# Patient Record
Sex: Female | Born: 1960 | Race: Black or African American | Hispanic: No | Marital: Single | Smoking: Never smoker
Health system: Southern US, Community
[De-identification: ages and names within clinical notes are randomized; demographics above are authoritative.]

## PROBLEM LIST (undated history)

## (undated) DIAGNOSIS — E119 Type 2 diabetes mellitus without complications: Secondary | ICD-10-CM

## (undated) DIAGNOSIS — E669 Obesity, unspecified: Secondary | ICD-10-CM

## (undated) HISTORY — DX: Type 2 diabetes mellitus without complications: E11.9

## (undated) HISTORY — PX: ABDOMINAL HYSTERECTOMY: SHX81

## (undated) HISTORY — DX: Obesity, unspecified: E66.9

---

## 2007-08-11 ENCOUNTER — Emergency Department (HOSPITAL_BASED_OUTPATIENT_CLINIC_OR_DEPARTMENT_OTHER): Admission: EM | Admit: 2007-08-11 | Discharge: 2007-08-11 | Payer: Self-pay | Admitting: Emergency Medicine

## 2009-02-20 ENCOUNTER — Emergency Department (HOSPITAL_COMMUNITY): Admission: EM | Admit: 2009-02-20 | Discharge: 2009-02-20 | Payer: Self-pay | Admitting: Family Medicine

## 2012-01-21 ENCOUNTER — Encounter: Payer: 59 | Attending: Family Medicine | Admitting: *Deleted

## 2012-01-28 ENCOUNTER — Encounter: Payer: Self-pay | Admitting: *Deleted

## 2012-01-28 NOTE — Progress Notes (Signed)
  Patient was seen on 01/21/2012 for the first of a series of three diabetes self-management courses at the [facility]. Current A1c on 08/15/11 was 7.4% The following learning objectives were met by the patient during this course:   Defines the role of glucose and insulin  Identifies type of diabetes and pathophysiology  Defines the diagnostic criteria for diabetes and prediabetes  States the risk factors for Type 2 Diabetes  States the symptoms of Type 2 Diabetes  Defines Type 2 Diabetes treatment goals  Defines Type 2 Diabetes treatment options  States the rationale for glucose monitoring  Identifies A1C, glucose targets, and testing times  Identifies proper sharps disposal  Defines the purpose of a diabetes food plan  Identifies carbohydrate food groups  Defines effects of carbohydrate foods on glucose levels  Identifies carbohydrate choices/grams/food labels  States benefits of physical activity and effect on glucose  Review of suggested activity guidelines  Handouts given during class include:  Type 2 Diabetes: Basics Book  My Food Plan Book  Food and Activity Log  Follow-Up Plan: Core Class 2

## 2012-09-08 ENCOUNTER — Encounter: Payer: 59 | Attending: Family Medicine | Admitting: *Deleted

## 2012-09-08 DIAGNOSIS — E119 Type 2 diabetes mellitus without complications: Secondary | ICD-10-CM | POA: Insufficient documentation

## 2012-09-08 DIAGNOSIS — Z713 Dietary counseling and surveillance: Secondary | ICD-10-CM | POA: Insufficient documentation

## 2012-09-09 NOTE — Progress Notes (Signed)
  Patient was seen on 09/08/12 for the second of a series of three diabetes self-management courses at the [facility]. The following learning objectives were met by the patient during this course:   Explain basic nutrition maintenance and quality assurance  Describe causes, symptoms and treatment of hypoglycemia and hyperglycemia  Explain how to manage diabetes during illness  Describe the importance of good nutrition for health and healthy eating strategies  List strategies to follow meal plan when dining out  Describe the effects of alcohol on glucose and how to use it safely  Describe problem solving skills for day-to-day glucose challenges  Describe strategies to use when treatment plan needs to change  Identify important factors involved in successful weight loss  Describe ways to remain physically active  Describe the impact of regular activity on insulin resistance   Handouts given in class:  Refrigerator magnet for Sick Day Guidelines  [facility] Oral medication/insulin handout  Follow-Up Plan: Patient will attend the final class of the ADA Diabetes Self-Care Education.    

## 2012-09-22 DIAGNOSIS — E119 Type 2 diabetes mellitus without complications: Secondary | ICD-10-CM

## 2012-09-23 NOTE — Progress Notes (Signed)
Patient was seen on 09/22/12 for the third of a series of three diabetes self-management courses at the [facility]. The following learning objectives were met by the patient during this course:    Describe how diabetes changes over time   Identify diabetes complications and ways to prevent them   Describe strategies that can promote heart health including lowering blood pressure and cholesterol   Describe strategies to lower dietary fat and sodium in the diet   Identify physical activities that benefit cardiovascular health   Describe role of stress on blood glucose and develop strategies to address psychosocial issues   Evaluate success in meeting personal goal   Describe the belief that they can live successfully with diabetes day to day   Establish 2-3 goals that they will plan to diligently work on until they return for the free 58-month follow-up visit  The following handouts were given in class:  Goal setting handout  Class evaluation form  Low-sodium seasoning tips  Stress management handout  Your patient has established the following 4 month goals for diabetes self-care:  Count carbohydrates at most meals and snacks  Reduce fat in my diet by eating less fried foods  Take diabetes medications as scheduled  Your patient has identified these potential barriers to change:  None indicated  Your patient has identified their diabetes self-care support plan as:  My sister   Follow-Up Plan: Patient was offered a 4 month follow-up visit for diabetes self-management education.

## 2013-01-26 ENCOUNTER — Ambulatory Visit: Payer: 59 | Admitting: *Deleted

## 2014-06-17 ENCOUNTER — Other Ambulatory Visit: Payer: Self-pay | Admitting: *Deleted

## 2014-06-19 NOTE — Patient Outreach (Addendum)
[facility]) Care Management   06/19/2014  Kimberly Wilkins 01/07/1961 [id number]  Kimberly AmorWillie D Creelman is an 54 y.o. female who presents for routine Link To Wellness follow up.  Subjective:  She states she continues to consistently follow a CHO controlled meal plan and is pleased with her ongoing weight loss and blood sugar readings. She says she feels much better and her goal is to lose a total of 30 lbs by May 30th. She says she only eats at restaurants that offer Weight Watcher options like [employer]. States her blood sugar variance both fasting and nonfasting is 74-153 for the last month. She is anxious to have her A1C checked when she sees Dr. Allena KatzPatel on 07/21/14 as she thinks it will have significantly improved.  Objective:   Review of Systems  Constitutional: Negative.     Physical Exam  Constitutional: She is oriented to person, place, and time. She appears well-developed and well-nourished.  Neurological: She is alert and oriented to person, place, and time.  Psychiatric: She has a normal mood and affect. Her behavior is normal. Judgment and thought content normal.   Filed Weights   06/17/14 1606  Weight: 225 lb (102.059 kg)   Filed Vitals:   06/17/14 1606  BP: 125/88    Current Medications:   Current Outpatient Prescriptions  Medication Sig Dispense Refill  . aspirin 81 MG tablet Take 81 mg by mouth daily.    Marland Kitchen. lisinopril (PRINIVIL,ZESTRIL) 2.5 MG tablet Take 2.5 mg by mouth daily.    . metFORMIN (GLUMETZA) 500 MG (MOD) 24 hr tablet Take 500 mg by mouth daily with breakfast.    . pravastatin (PRAVACHOL) 40 MG tablet Take 40 mg by mouth daily.     No current facility-administered medications for this visit.    Functional Status:   In your present state of health, do you have any difficulty performing the following activities: 06/17/2014  Hearing? N  Vision? N  Difficulty concentrating or making decisions? N  Walking or climbing stairs? N  Dressing or  bathing? N  Doing errands, shopping? N    Fall/Depression Screening:    PHQ 2/9 Scores 06/17/2014  PHQ - 2 Score 0   [facility] CM Care Plan        Patient Outreach from 06/17/2014 in [facility] Link To Wellness   Care Plan Problem One  Type II DM currently meeting CBG pre and post meal targets but most recent A1C not meeting target as evidenced by 8.2% on 05/13/14   Care Plan for Problem One  Active   Interventions for Problem One Long Term Goal  reviewed basic core metabolic defects associated with Type II DM, reviewed role of obesity on insulin resistance, congratulated Kimberly Wilkins on her ongoing weight loss, reviewed patient medications and reviewed mechanism of action of Glumetza and Januvia,  reviewed effect of exercise on inuslin sensitivity and congratulated her on her consistent exercise program, reviewed meal plan and strategies to use when eating out to avoid CHO overloading, reviewed patient's glucometer history , pre and post meal targets and congratulated Kimberly Wilkins on her excellent readings for the past month, reviewed upcoming appointment with Dr. Allena KatzPatel on 07/21/14 and reinforced the importance of keeping the appointment   [facility]31-[facility])  Improved glycemic control as evidenced by A1C <7.5% at next check and CBGs meeting target 75% of the time   [facility] Start Date  06/17/14     Assessment:   Link  To Wellness patient demonstrating significant behavior changes resulting in significantly improved pre and post meal blood sugars.  Plan:  RNCM to fax today's office visit note to Dr. Allena Katz. RNCM will meet monthly and as needed with patient per Link To Wellness program guidelines to assist with Type II DM self-management and assess patient's progress toward mutually set goals.   Bary Richard RN,CCM,CDE [facility] Care Management Coordinator Office Phone [phone] Office Fax [phone]

## 2014-09-21 ENCOUNTER — Other Ambulatory Visit: Payer: Self-pay | Admitting: *Deleted

## 2014-09-21 NOTE — Patient Outreach (Addendum)
Kimberly Wilkins was instructed to follow up with this RNCM after she saw Dr. Allena KatzPatel in mid May 2016 to arrange for Link To Wellness follow up but no call has been received. Message left on Kimberly Wilkins's cell phone number to contact this RNCM as soon as possible to arrange follow up.  Kimberly RichardJanet S. Virgie Chery RN,CCM,CDE [facility] Care Management Coordinator Link To Wellness Office Phone [phone] Office Fax [phone]

## 2014-09-22 ENCOUNTER — Other Ambulatory Visit: Payer: Self-pay | Admitting: *Deleted

## 2014-09-22 NOTE — Patient Outreach (Signed)
Spoke with Kimberly Wilkins via phone after she left message this morning returning this RNCM's call from yesterday. She says her A1C was 7.8% or 7.9% when Dr. Allena Katz checked it in mid May. She says she forgot she was to call and arrange Link To Wellness follow up. She says the only change Dr. Allena Katz made to her treatment plan was to increase her Lisinopril but she is not sure of the dosage. He told her he was pleased with her lifestyle changes of consistent exercise and consistent adherence to a CHO controlled meal plan. Link To Wellness follow up arranged for August 18th at 11:00am. Bary Richard RN,CCM,CDE [facility] Care Management Coordinator Link To Wellness Office Phone [phone] Office Fax [phone]

## 2014-10-17 ENCOUNTER — Other Ambulatory Visit: Payer: Self-pay | Admitting: *Deleted

## 2014-10-17 ENCOUNTER — Encounter: Payer: Self-pay | Admitting: *Deleted

## 2014-10-17 NOTE — Patient Outreach (Signed)
[facility]) Care Management   10/17/2014  Kimberly Wilkins 1960-11-14 [id number]  Kimberly Wilkins is an 54 y.o. female who presents to the [facility] CM office for routine Link To Wellness follow up for Type II DM and hyperlipidemia self management assistance.  Subjective:  Kimberly Wilkins is frustrated that she cannot lose weight faster, her short term goal is to get under 200 lbs and eventually weight 165 lbs. She continues to check her CBG 1-2 and sometimes 3x daily and she is pleased with the readings as they consistently meet targets. She says she will see Dr. Allena Katz on 11/13/14 and he will check her A1C.  Objective:   Review of Systems  Constitutional: Negative.     Physical Exam  Constitutional: She is oriented to person, place, and time. She appears well-developed and well-nourished.  Neurological: She is alert and oriented to person, place, and time.  Skin: Skin is warm and dry.  Psychiatric: She has a normal mood and affect. Her behavior is normal. Judgment and thought content normal.   Filed Weights   10/17/14 1119  Weight: 220 lb 12.8 oz (100.154 kg)   Filed Vitals:   10/17/14 1119  BP: 120/65   Current Medications:   Current Outpatient Prescriptions  Medication Sig Dispense Refill  . lisinopril (PRINIVIL,ZESTRIL) 2.5 MG tablet Take 2.5 mg by mouth daily.    . metFORMIN (GLUMETZA) 500 MG (MOD) 24 hr tablet Take 500 mg by mouth daily with breakfast.    . pravastatin (PRAVACHOL) 40 MG tablet Take 40 mg by mouth daily.    . sitaGLIPtin (JANUVIA) 100 MG tablet Take 100 mg by mouth daily.    Marland Kitchen aspirin 81 MG tablet Take 81 mg by mouth daily.     No current facility-administered medications for this visit.    Functional Status:   In your present state of health, do you have any difficulty performing the following activities: 10/17/2014 06/17/2014  Hearing? N N  Vision? N N  Difficulty concentrating or making decisions? N N  Walking or climbing stairs? N N  Dressing  or bathing? N N  Doing errands, shopping? N N    Fall/Depression Screening:    PHQ 2/9 Scores 06/17/2014  PHQ - 2 Score 0   [facility] CM Care Plan Problem One        Patient Outreach from 10/17/2014 in [employer]   Care Plan Problem One  Type II DM currently meeting CBG pre and post meal targets but most recent A1C not meeting target as evidenced by 8.2% on 05/13/14   Care Plan for Problem One  Active   [facility] Long Term Goal (31-90 days)  Improved glycemic control as evidenced by A1C <7.5% at next check and CBGs meeting target 75% of the time   [facility] Long Term Goal Start Date  10/17/14   Interventions for Problem One Long Term Goal  reviewed basic core metabolic defects associated with Type II DM, reviewed role of obesity on insulin resistance, congratulated Kimberly Wilkins on her ongoing weight loss, reviewed patient medications and reviewed mechanism of action of Glumetza and Januvia, reviewed effect of exercise on insulin sensitivity and congratulated her on her consistent exercise program, reviewed meal plan, reviewed patient's glucometer history , pre and post meal targets and congratulated Kimberly Wilkins on her excellent readings for the past month, reviewed upcoming appointment with Dr. Allena Katz on 11/13/14 and reinforced the importance of keeping the appointment, timing of Link To Wellness follow up will be based on  A1C results when she sees Dr. Allena Katz in September     Assessment:   [employer] employee and Link To Wellness member with Type II DM, all glucometer readings meeting blood sugar targets, BP meeting target, will see her primary care provider on 11/13/14. Obese but continues to lose weight with CHO controlled diet and consistent exercise regimen.  Plan:  RNCM to fax today's office visit note to Dr. Ivory Broad and Dr. Allena Katz. RNCM will meet quarterly and as needed with patient per Link To Wellness program guidelines to assist with Type II DM and weight loss self-management and assess patient's progress  toward mutually set goals.  Bary Richard RN,CCM,CDE [facility] Care Management Coordinator Link To Wellness Office Phone [phone] Office Fax [phone]

## 2015-02-24 ENCOUNTER — Other Ambulatory Visit: Payer: Self-pay | Admitting: *Deleted

## 2015-02-24 NOTE — Patient Outreach (Signed)
Returned call to [name] per her request to arrange Link To Wellness follow up appointment. Left message on her mobile number asking her to return call and leave dates and times she can meet in the next month. Bary RichardJanet S. Kharisma Glasner RN,CCM,CDE [facility] Care Management Coordinator Link To Wellness Office Phone [phone] Office Fax [phone]

## 2015-03-17 MED FILL — TRUEtest TEST STRP: 33 days supply | Qty: 200 | Fill #1

## 2015-03-27 DIAGNOSIS — I10 Essential (primary) hypertension: Secondary | ICD-10-CM | POA: Diagnosis not present

## 2015-03-27 DIAGNOSIS — E785 Hyperlipidemia, unspecified: Secondary | ICD-10-CM | POA: Diagnosis not present

## 2015-03-27 DIAGNOSIS — E1165 Type 2 diabetes mellitus with hyperglycemia: Secondary | ICD-10-CM | POA: Diagnosis not present

## 2015-03-27 MED FILL — JANUVIA 100 MG TABLET: 100 | 30 days supply | Qty: 30 | Fill #0

## 2015-03-30 ENCOUNTER — Ambulatory Visit: Payer: Self-pay | Admitting: *Deleted

## 2015-04-10 ENCOUNTER — Ambulatory Visit: Payer: Self-pay | Admitting: *Deleted

## 2015-05-01 MED FILL — PRAVASTATIN NA 40 MG TAB: 40 | 90 days supply | Qty: 90 | Fill #3

## 2015-05-01 MED FILL — metFORMIN HCL 1000 MG TABS: 1000 | 90 days supply | Qty: 180 | Fill #3

## 2015-05-01 MED FILL — JANUVIA 100 MG TABLET: 100 | 30 days supply | Qty: 30 | Fill #1

## 2015-05-23 ENCOUNTER — Other Ambulatory Visit: Payer: Self-pay | Admitting: *Deleted

## 2015-05-23 NOTE — Patient Outreach (Signed)
[facility]) Care Management   05/23/2015  Kimberly Wilkins 31-Mar-1960 [id number]  Kimberly Wilkins is an 55 y.o. female who presents to the [facility] Care Management office for routine Link To Wellness follow up for self management assistance with Type II DM.  Subjective:  [name] says she saw Dr. Allena Katz in February and she had gained 11 lbs since her last visit with him and she attributed the weight gain to holiday eating. She says her metformin dose was increased to achieve fasting blood sugar target of 80-100. She says her goal is to weight <218  lbs when she sess Dr. Allena Katz in May. She states her long term weight loss goals if <200 lbs and eventually 175 lbs so that she has a normal BMI. She says she is following a 1200 calorie diet and uses a teflon pan for frying so she doesn't have to use any oil or butter.  She says she is checking her blood sugar at least twice daily and sometimes more often to help her identify foods that elevate her blood sugar above target. She reports fasting blood sugar this morning as 124 and she says any time she eats fruit late in the evening her blood sugar is always higher than target the next morning. She says she has been using nuts as a snack instead of fruit to lower her blood sugars. 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  Objective:   Review of Systems  Constitutional: Negative.     Physical Exam  Constitutional: She is oriented to person, place, and time. She appears well-developed and well-nourished.  Respiratory: Effort normal.  Neurological: She is alert and oriented to person, place, and time.  Skin: Skin is warm and dry.  Psychiatric: She has a normal mood and affect. Her behavior is normal. Judgment and thought content normal.   Filed Vitals:   05/23/15 1212  BP: 112/75   Filed Weights   05/23/15 1212  Weight: 226 lb (102.513  kg)   Current Medications:   Current Outpatient Prescriptions  Medication Sig Dispense Refill  . lisinopril (PRINIVIL,ZESTRIL) 2.5 MG tablet Take 2.5 mg by mouth daily.    . metFORMIN (GLUMETZA) 500 MG (MOD) 24 hr tablet Take 1,000 mg by mouth 2 (two) times daily with a meal.     . pravastatin (PRAVACHOL) 40 MG tablet Take 40 mg by mouth daily.    . sitaGLIPtin (JANUVIA) 100 MG tablet Take 100 mg by mouth daily.    Marland Kitchen aspirin 81 MG tablet Take 81 mg by mouth daily. Reported on 05/23/2015. States she is not taking.      No current facility-administered medications for this visit.    Functional Status:   In your present state of health, do you have any difficulty performing the following activities: 05/23/2015 10/17/2014  Hearing? N N  Vision? N N  Difficulty concentrating or making decisions? N N  Walking or climbing stairs? N N  Dressing or bathing? N N  Doing errands, shopping? N N    Fall/Depression Screening:    PHQ 2/9 Scores 05/23/2015 06/17/2014  PHQ - 2 Score 0 0    Assessment:   [employer] employee and Link To Wellness member with Type II DM currently not meeting Hgb A1C target of <7.0% ( Hgb A1C= 7.8% in February 2017)  Plan:    [facility] CM Care Plan Problem One  Most Recent Value   Care Plan Problem One  Type II DM currently meeting most CBG pre and post meal targets but most recent Hgb A1C not meeting target as evidenced by 7.8%  in February 2017   Role Documenting the Problem One  Care Management Coordinator   Care Plan for Problem One  Active   [facility] Long Term Goal (31-90 days)  Improved glycemic control as evidenced by Hgb A1C <7.0% at next check, CBGs meeting target 75% of the time, and weight of 218 lbs or less at next Link To Wellness visit   [facility] Long Term Goal Start Date  05/23/15   Interventions for Problem One Long Term Goal  reviewed basic core metabolic defects associated with Type II DM, reviewed role of central obesity on insulin resistance, discussed  strategies for weight loss success, provided glycemic index food chart to post on her refrigerator to help her with food choices and glycemic control, reviewed patient medications and reviewed mechanism of action of Glumetza and Januvia, reviewed effect of exercise on insulin sensitivity and congratulated her on her consistent exercise program, reviewed meal plan, reviewed patient's glucometer history , reviewed pre and post meal targets, discussed [employer] app and encouraged her to enroll if she has the opportunity this fall to provided electronic device assistance with DM management, reviewed upcoming appointment with Dr. Allena KatzPatel in May and reinforced the importance of keeping the appointment, arranged for Link To Wellness follow up in June      RNCM to fax today's office visit note to Dr. Allena KatzPatel. RNCM will meet quarterly and as needed with patient per Link To Wellness program guidelines to assist with Type II DM self-management and assess patient's progress toward mutually set goals.  Bary RichardJanet S. Hauser RN,CCM,CDE [facility] Care Management Coordinator Link To Wellness Office Phone [phone] Office Fax [phone]

## 2015-05-25 ENCOUNTER — Ambulatory Visit: Payer: Self-pay | Admitting: *Deleted

## 2015-05-26 MED FILL — JANUVIA 100 MG TABLET: 100 | 30 days supply | Qty: 30 | Fill #2

## 2015-05-26 MED FILL — LISINOPRIL 5 MG TABLET: 5 | 90 days supply | Qty: 90 | Fill #3

## 2015-06-28 MED FILL — JANUVIA 100 MG TABLET: 100 | 30 days supply | Qty: 30 | Fill #3

## 2015-08-02 DIAGNOSIS — E782 Mixed hyperlipidemia: Secondary | ICD-10-CM | POA: Diagnosis not present

## 2015-08-02 DIAGNOSIS — I10 Essential (primary) hypertension: Secondary | ICD-10-CM | POA: Diagnosis not present

## 2015-08-02 DIAGNOSIS — E1165 Type 2 diabetes mellitus with hyperglycemia: Secondary | ICD-10-CM | POA: Diagnosis not present

## 2015-08-02 MED FILL — JANUVIA 100 MG TABLET: 100 | 90 days supply | Qty: 90 | Fill #0 | Status: TO

## 2015-08-02 MED FILL — metFORMIN HCL 1000 MG TABS: 1000 | 90 days supply | Qty: 180 | Fill #0 | Status: TO

## 2015-08-02 MED FILL — LISINOPRIL 5 MG TABLET: 5 | 90 days supply | Qty: 90 | Fill #0

## 2015-08-07 DIAGNOSIS — E1165 Type 2 diabetes mellitus with hyperglycemia: Secondary | ICD-10-CM | POA: Diagnosis not present

## 2015-08-07 DIAGNOSIS — I10 Essential (primary) hypertension: Secondary | ICD-10-CM | POA: Diagnosis not present

## 2015-08-07 DIAGNOSIS — E782 Mixed hyperlipidemia: Secondary | ICD-10-CM | POA: Diagnosis not present

## 2015-08-29 ENCOUNTER — Encounter: Payer: Self-pay | Admitting: *Deleted

## 2015-08-29 ENCOUNTER — Other Ambulatory Visit: Payer: Self-pay | Admitting: *Deleted

## 2015-08-29 VITALS — BP 138/88 | Ht 67.5 in | Wt 227.8 lb

## 2015-08-29 DIAGNOSIS — E119 Type 2 diabetes mellitus without complications: Secondary | ICD-10-CM

## 2015-08-29 DIAGNOSIS — E669 Obesity, unspecified: Secondary | ICD-10-CM

## 2015-08-29 NOTE — Patient Outreach (Signed)
[facility]N) Care Management   08/29/2015  Kimberly Wilkins 06/03/1960 [id number]  Kimberly AmorWillie D Wilkins is an 55 y.o. female who presents to the [address] [facility] Care Management office for routine Link To Wellness follow up for self management assistance with Type II DM and obesity.  Subjective: Kimberly Wilkins says the stress in her life just decreased dramatically as she graduated from [employer] in [place] with a master's degree in Electronic Data SystemsHealth Care Administration. She says she hopes to remain with [employer] in a new position that would support her educational background as she has 25 years of service with [employer]. She says she has been focusing on her exercise program and works out every day, either at the gym at [employer] or at [employer]. She continues to check her blood sugar 2-3 times daily and says she has not seen any numbers over 200 in the last few months. She brings her lab report of 6/5 and says her POC Hgb A1C was 7.3% so she says Dr. Allena KatzPatel was happy although he wants her Hgb A1C <6.5%. She is frustrated that she has not been able to lose any weight despite following a CHO controlled meal plan and exercising. She agrees to meet with a RD for weight management assistance.  Objective:   Review of Systems  Constitutional: Negative.     Physical Exam  Constitutional: She is oriented to person, place, and time. She appears well-developed and well-nourished.  Respiratory: Effort normal.  Neurological: She is alert and oriented to person, place, and time.  Skin: Skin is warm and dry.  Psychiatric: She has a normal mood and affect. Her behavior is normal. Judgment and thought content normal.   Filed Weights   08/29/15 1120  Weight: 227 lb 12.8 oz (103.329 kg)   Filed Vitals:   08/29/15 1120  BP: 138/88    Encounter Medications:   Outpatient Encounter Prescriptions as of 08/29/2015  Medication Sig Note  . lisinopril (PRINIVIL,ZESTRIL) 2.5 MG  tablet Take 2.5 mg by mouth daily. 10/17/2014: Taking 5 mg now 07/21/14  . metFORMIN (GLUMETZA) 500 MG (MOD) 24 hr tablet Take 1,000 mg by mouth 2 (two) times daily with a meal.    . pravastatin (PRAVACHOL) 40 MG tablet Take 40 mg by mouth daily.   . sitaGLIPtin (JANUVIA) 100 MG tablet Take 100 mg by mouth daily.   Marland Kitchen. aspirin 81 MG tablet Take 81 mg by mouth daily. Reported on 08/29/2015 08/29/2015: MD told her she doesn't have to take it until you are older   No facility-administered encounter medications on file as of 08/29/2015.    Functional Status:   In your present state of health, do you have any difficulty performing the following activities: 08/29/2015 05/23/2015  Hearing? N N  Vision? N N  Difficulty concentrating or making decisions? N N  Walking or climbing stairs? N N  Dressing or bathing? N N  Doing errands, shopping? N N    Fall/Depression Screening:    PHQ 2/9 Scores 05/23/2015 06/17/2014  PHQ - 2 Score 0 0    Assessment:    employee and Link To Wellness member with Type II DM and obesity with improved glycemic control as evidenced by Hgb A1C= 7.3% on 07/05/15, (previous Hgb A1C=7.8% in Feb 2017)  normal lipid panel on 08/07/15 and BP consistently meeting targets.  Plan:   [facility] Care [facility] Problem One        Most Recent Value  Care Plan Problem One  Type II DM and obesity (BMI= 35.13) currently meeting most CBG pre and post meal targets, improved glycemic control as evidenced by Hgb A1C= 7.3% on 07/05/15 previous Hgb A1C= 7.8%  in February 2017   Role Documenting the Problem One  Care Management Coordinator   Care Plan for Problem One  Active   THN Long Term Goal (31-90 days)  Improved glycemic control as evidenced by Hgb A1C <7.0% at next check, CBGs meeting target 75% of the time, and evidence of weight loss and/or no weight gain at next Link To Wellness visit   THN Long Term Goal Start Date  08/29/15   Interventions for Problem One Long Term Goal  Congratulated Kimberly Wilkins  on achieving her master's degree in Electronic Data SystemsHealth Care Administration, reviewed basic core metabolic defects associated with Type II DM, reviewed role of central obesity on insulin resistance, discussed strategies for weight loss success,  made referral to RD for assist with weight management, reviewed patient medications and reviewed mechanism of action of Glumetza and Januvia, reviewed effect of exercise on insulin sensitivity and congratulated her on her consistent exercise program, reviewed meal plan, reviewed patient's glucometer history , reviewed pre and post meal targets, instructed her on the use of the True Metrix glucometer and advised her to go to [facility] to pick up a True Metrix meter and ask the pharmacy staff to set the correct time and date, assisted Kimberly Wilkins with claiming her [facility] self knowledge badge and logging her activity on the [employer] Well website,reviewed upcoming appointment with Dr. Allena KatzPatel on Oct 2nd and reinforced the importance of keeping the appointment, arranged for Link To Wellness follow up on 10/19/15      RNCM to fax today's office visit note to Dr. Allena KatzPatel.  RNCM will meet quarterly and as needed with patient per Link To Wellness program guidelines to assist with Type II DM and obesity self-management and assess patient's progress toward mutually set goals.  Bary RichardJanet S. Kyelle Urbas RN,CCM,CDE [facility] Care Management Coordinator Link To Wellness Office Phone [phone] Office Fax [phone]

## 2015-08-30 MED FILL — PRAVASTATIN NA 40 MG TAB: 40 | 30 days supply | Qty: 30 | Fill #0

## 2015-08-30 MED FILL — TRUE METRIX GLUCOSE TEST ST: 33 days supply | Qty: 200 | Fill #0

## 2015-10-02 ENCOUNTER — Ambulatory Visit: Payer: 59 | Admitting: Skilled Nursing Facility1

## 2015-10-10 DIAGNOSIS — H35131 Retinopathy of prematurity, stage 2, right eye: Secondary | ICD-10-CM | POA: Diagnosis not present

## 2015-10-10 DIAGNOSIS — H2513 Age-related nuclear cataract, bilateral: Secondary | ICD-10-CM | POA: Diagnosis not present

## 2015-10-10 DIAGNOSIS — H524 Presbyopia: Secondary | ICD-10-CM | POA: Diagnosis not present

## 2015-10-10 DIAGNOSIS — E119 Type 2 diabetes mellitus without complications: Secondary | ICD-10-CM | POA: Diagnosis not present

## 2015-10-16 MED FILL — PRAVASTATIN NA 40 MG TAB: 40 | 30 days supply | Qty: 30 | Fill #1

## 2015-10-19 ENCOUNTER — Encounter: Payer: 59 | Attending: Family Medicine | Admitting: Dietician

## 2015-10-19 ENCOUNTER — Encounter: Payer: Self-pay | Admitting: Dietician

## 2015-10-19 ENCOUNTER — Other Ambulatory Visit: Payer: Self-pay | Admitting: *Deleted

## 2015-10-19 VITALS — BP 120/75 | Ht 67.5 in | Wt 230.8 lb

## 2015-10-19 DIAGNOSIS — E119 Type 2 diabetes mellitus without complications: Secondary | ICD-10-CM

## 2015-10-19 DIAGNOSIS — Z713 Dietary counseling and surveillance: Secondary | ICD-10-CM | POA: Diagnosis not present

## 2015-10-19 LAB — POCT CBG (FASTING - GLUCOSE)-MANUAL ENTRY: Glucose Fasting, POC: 150 mg/dL — AB (ref 70–99)

## 2015-10-19 LAB — POCT GLYCOSYLATED HEMOGLOBIN (HGB A1C): Hemoglobin A1C: 8.2

## 2015-10-19 NOTE — Progress Notes (Signed)
Medical Nutrition Therapy:  Appt start time: 0920 end time:  1035.   Assessment:  Primary concerns today: "Kimberly Wilkins" is here today to address her diet. She sees Cran[place] MonJanet Hauser[facility] for diabetes education. She states her blood sugars have been more out of control lately. Kimberly Wilkins just finished school and was not able to work as much when she was in school; having a lot of financial stress. Is also in menopause and having severe hot flashes. Does not wish to begin hormone replacement therapy. She reports her last HgbA1c was 7.3% and she has another one scheduled in October. Kimberly Wilkins reports that she exercises frequently, sometimes 2x a day. Has always considered herself an active person. She lives with her sister and they try to split meals at restaurants; her sister has diabetes as well. 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    Patient declined updated weight in office today.   Average blood glucose 200 mg/dL in the am (patient report they used to be about 120 mg/dL).  Tests blood sugar 3-4x a day. She can feel it when her blood sugar is low or high. Occasionally keeps a symptom log. She checks her feet daily and has regular eye and dental exams.   Patient-reported goals: A1c 6.5% and lose 40 lbs  Preferred Learning Style:   No preference indicated   Learning Readiness:   Ready   MEDICATIONS: see list   DIETARY INTAKE:  Usual eating pattern includes 3 meals and 2-3 snacks per day. Avoided foods include regular soda, fried foods, fast food, red meat, cake, a lot of bread.    24-hr recall:  B ( AM): 2 packs grits, egg, toast OR 2 boiled eggs, piece of sausage, cup of coffee  Snk ( AM): sometimes peanut butter crackers if blood sugar drops  L ( PM): salad with Glucerna OR Malawiturkey club sandwich  Snk ( PM): sometimes fruit cup with chia seeds or fresh fruit D ( PM): baked turkey/chicken/fish and greens  Snk ( PM): sometimes an  apple  Beverages: lots of water, water with sugar free flavoring, occasionally coffee with Splenda and 2 creams, hot tea with honey at night, 1 diet soda every few weeks, occasionally wine in the evenings  Usual physical activity: Elliptical for 30-40 minutes + core circuit (1-1.5 hours total), 15-20 minutes elliptical during lunch break at work (4-5 days a week)  Estimated energy needs: 1500-1700 calories 170-180 g carbohydrates 112-120 g protein 42-44 g fat  Progress Towards Goal(s):  In progress.   Nutritional Diagnosis:  Maxwell-2.2 Altered nutrition-related laboratory As related to history of excessive carbohydrate intake and significant family history of diabetes.  As evidenced by HgbA1c 7.3%.    Intervention:  Nutrition counseling provided. Goals: -Continue to avoid drinks with sugar -Continue to avoid fried foods -Limit servings of fruit to tennis-ball size and pair with a protein  -Avoid buying "trigger" foods -Continue to pre-portion food or buy individually packaged snacks -Choose higher fiber and lower sugar carbs -Continue to fill up on fresh/frozen non starchy vegetables! -Breakfast: have 1 packet grits and piece of toast OR 2 packets grits and no toast  -Have an evening snack before bed with 15 grams carb (small apple) plus protein!  Proteins: [place] yogurt, string cheese, peanut butter, nuts -Look into getting an air fryer  Teaching Method Utilized:  Visual Auditory Hands on  Handouts given during visit include:  MyPlate  Barriers to learning/adherence to  lifestyle change: uncontrolled blood glucose  Demonstrated degree of understanding via:  Teach Back   Monitoring/Evaluation:  Dietary intake, exercise, labs, and body weight prn.

## 2015-10-19 NOTE — Patient Instructions (Addendum)
-  Continue to avoid drinks with sugar -Continue to avoid fried foods -Limit servings of fruit to tennis-ball size and pair with a protein  -Avoid buying "trigger" foods -Continue to pre-portion food or buy individually packaged snacks -Choose higher fiber and lower sugar carbs -Continue to fill up on fresh/frozen non starchy vegetables! -Breakfast: have 1 packet grits and piece of toast OR 2 packets grits and no toast  -Have an evening snack before bed with 15 grams carb (small apple) plus protein!  Proteins: Austria[place] yogurt, string cheese, peanut butter, nuts -Look into getting an air fryer

## 2015-10-25 NOTE — Patient Outreach (Signed)
[facility]) Care Management   10/19/2015  Kimberly Wilkins 05/03/1960 [id number]  Kimberly Wilkins is an 55 y.o. female who presents to the [address] [facility] Care Management office for routine Link To Wellness follow up for self management assistance with Type II DM and morbid obesity.  Subjective: Kimberly Wilkins says she is very discouraged as she says her blood sugars are higher than usual and she does not know how to get them back down. She identifies stress as a factor a she is getting calls from bill collectors, she said she got behind on her bills while she was finishing school and not able to work as many hours as usual.  She also says she is experiencing menopause symptoms and thinks she had an undiagnosed urinary track infection recently. She reports that her fasting blood sugar usually varies between 80-100 but lately she is seeing fasting sugars as high as 200-215. She says she has been researching why she is having spikes in the morning and wonders if it is The McKessonDawn Phenomenon, so she has been setting her alarm to wake up at 4 AM to check her sugar. She reports her 4 am blood sugar variance is 140-255. She says some days she checks her blood sugar 4 times daily, upon waking, midday, at dinner and at 4 am.  [name] also reports she has gained some of the weight she lost so she saw a registered dietician this morning and she recommended that she increase the fiber in her diet, be more cognizant of portion size and keep a food journal.  She says she has not yet established a primary care provider and will she her endocrinologist in the fall.  Objective:   Review of Systems  Constitutional: Negative.     Physical Exam  Constitutional: She is oriented to person, place, and time. She appears well-developed and well-nourished.  Respiratory: Effort normal.  Neurological: She is alert and oriented to person, place, and time.  Skin: Skin is warm and dry.   Psychiatric: She has a normal mood and affect. Her behavior is normal. Judgment and thought content normal.   Filed Weights   10/19/15 1103  Weight: 230 lb 12.8 oz (104.7 kg)   Vitals:   10/19/15 1103  BP: 128/75   POC fasting CBG= 150 POC Hgb A1C= 8.2%  Encounter Medications:   Outpatient Encounter Prescriptions as of 10/19/2015  Medication Sig Note  . lisinopril (PRINIVIL,ZESTRIL) 2.5 MG tablet Take 2.5 mg by mouth daily. 10/17/2014: Taking 5 mg now 07/21/14  . metFORMIN (GLUMETZA) 500 MG (MOD) 24 hr tablet Take 1,000 mg by mouth 2 (two) times daily with a meal.    . pravastatin (PRAVACHOL) 40 MG tablet Take 40 mg by mouth daily.   . sitaGLIPtin (JANUVIA) 100 MG tablet Take 100 mg by mouth daily.   Marland Kitchen. aspirin 81 MG tablet Take 81 mg by mouth daily. Reported on 08/29/2015 08/29/2015: MD told her she doesn't have to take it until you are older   No facility-administered encounter medications on file as of 10/19/2015.     Functional Status:   In your present state of health, do you have any difficulty performing the following activities: 08/29/2015 05/23/2015  Hearing? N N  Vision? N N  Difficulty concentrating or making decisions? N N  Walking or climbing stairs? N N  Dressing or bathing? N N  Doing errands, shopping? N N  Some recent data might be hidden    Fall/Depression  Screening:    PHQ 2/9 Scores 10/19/2015 05/23/2015 06/17/2014  PHQ - 2 Score 0 0 0    Assessment:  [employer] employee and Link To Wellness member with Type II DM and obesity, with worsening glycemic control as evidenced by today's POC Hgb A1C= 8.2% (up from 7.3% on 07/05/15), morbid obesity with weight gain of 3 lbs since last visit with body mass index=  35.6.   Plan:  [facility] CM Care Plan Problem One   Flowsheet Row Most Recent Value  Care Plan Problem One Type II DM significant worsening of glycemic control as evidenced by POC Hgb A1C= 8.2%, previously 7.3% on 07/05/15, morbid obesity with weight gain of 3 lbs  since last visit with body mas index now 35.6 previously 35.13)   Role Documenting the Problem One  Care Management Coordinator  Care Plan for Problem One  Active  THN Long Term Goal (31-90 days)  Improved glycemic control as evidenced by Hgb A1C <8.0% at next check, CBGs meeting target 75% of the time, and evidence of weight loss and/or no weight gain at next Link To Wellness visit  THN Long Term Goal Start Date  10/19/15  Interventions for Problem One Long Term Goal Reviewed basic core metabolic defects associated with Type II DM, discussed possible reasons for increased fasting blood sugars and how to improve them, congratulated Kimberly Wilkins on her problem solving skills by checking her 4 am blood sugar to assess foe Dawn Phenomenon, provided handout on the "Dawn Phenomenon", reviewed role of central obesity on insulin resistance, discussed strategies for weight loss success, discussed today's appointment with RD and  reviewed goals and key points for assist with weight management, reviewed patient medications and reviewed mechanism of action of Glumetza and Januvia, reviewed effect of exercise on insulin sensitivity and congratulated her on her consistent exercise program, reviewed meal plan, reviewed patient's glucometer history , reviewed pre and post meal targets, reviewed upcoming appointment with Dr. Allena KatzPatel on Oct 2nd and reinforced the importance of keeping the appointment, will arrange for Link To Wellness follow up after she sees her endocrinologist on 12/04/15      RNCM to fax today's office visit note to Dr[facility]el [facility] will meet quarterly and as needed with patient per Link To Wellness program guidelines to assist with Type II DM and morbid obesity self-management and assess patient's progress toward mutually set goals.  Bary RichardJanet S. Khyren Hing RN,CCM,CDE [facility] Care Management Coordinator Link To Wellness Office Phone [phone] Office Fax [phone]

## 2015-11-03 MED FILL — JANUVIA 100 MG TABLET: 100 | 30 days supply | Qty: 30 | Fill #0

## 2015-11-03 MED FILL — metFORMIN HCL 1000 MG TABS: 1000 | 90 days supply | Qty: 180 | Fill #0 | Status: TO

## 2015-11-21 MED FILL — LISINOPRIL 5 MG TABLET: 5 | 90 days supply | Qty: 90 | Fill #1

## 2015-11-21 MED FILL — PRAVASTATIN NA 40 MG TAB: 40 | 30 days supply | Qty: 30 | Fill #2

## 2015-12-05 ENCOUNTER — Other Ambulatory Visit: Payer: Self-pay | Admitting: *Deleted

## 2015-12-05 NOTE — Patient Outreach (Signed)
Message left on [name]'s mobile number requesting she call to schedule Link To Wellness follow up appointment. Await return call. Bary RichardJanet S. Tanee Henery RN,CCM,CDE [facility] Care Management Coordinator Link To Wellness Office Phone [phone] Office Fax [phone]

## 2015-12-06 MED FILL — JANUVIA 100 MG TABLET: 100 | 90 days supply | Qty: 90 | Fill #4

## 2015-12-11 MED FILL — TRUE METRIX GLUCOSE TEST ST: 33 days supply | Qty: 200 | Fill #1

## 2015-12-20 DIAGNOSIS — I10 Essential (primary) hypertension: Secondary | ICD-10-CM | POA: Diagnosis not present

## 2015-12-20 DIAGNOSIS — E1165 Type 2 diabetes mellitus with hyperglycemia: Secondary | ICD-10-CM | POA: Diagnosis not present

## 2015-12-20 DIAGNOSIS — E782 Mixed hyperlipidemia: Secondary | ICD-10-CM | POA: Diagnosis not present

## 2016-01-01 MED FILL — PRAVASTATIN NA 40 MG TAB: 40 | 30 days supply | Qty: 30 | Fill #3

## 2016-01-05 MED FILL — BD PEN NDL MINI 31GX5MM: 31G X 5 MM | 90 days supply | Qty: 100 | Fill #0

## 2016-01-12 MED FILL — VICTOZA 18 MG/3 ML INJECT P: 18 | 30 days supply | Qty: 9 | Fill #0

## 2016-01-18 ENCOUNTER — Ambulatory Visit: Payer: Self-pay | Admitting: *Deleted

## 2016-01-18 ENCOUNTER — Other Ambulatory Visit: Payer: Self-pay | Admitting: *Deleted

## 2016-01-18 NOTE — Patient Outreach (Signed)
Kimberly Wilkins did not keep her 10:00 am Link To Wellness appointment today. Message left on Kimberly Wilkins mobile number requesting she call this RNCM with an update on the status of her Type II DM management since Victoza was initiated by her endocrinologist on 12/20/15 for a Hgb A1C of 8.0% with concurrent weight gain. Await return call. [name] RN,CCM,CDE [facility] Care Management Coordinator Link To Wellness Office Phone [phone] Office Fax [phone]

## 2016-02-02 MED FILL — PRAVASTATIN NA 40 MG TAB: 40 | 30 days supply | Qty: 30 | Fill #4

## 2016-02-02 MED FILL — metFORMIN HCL 1000 MG TABS: 1000 | 90 days supply | Qty: 180 | Fill #0

## 2016-02-06 DIAGNOSIS — Z1231 Encounter for screening mammogram for malignant neoplasm of breast: Secondary | ICD-10-CM | POA: Diagnosis not present

## 2016-02-12 MED FILL — VICTOZA 18 MG/3 ML INJECT P: 18 | 30 days supply | Qty: 9 | Fill #1

## 2016-03-11 MED FILL — LISINOPRIL 5 MG TABLET: 5 | 30 days supply | Qty: 30 | Fill #0

## 2016-03-11 MED FILL — PRAVASTATIN NA 40 MG TAB: 40 | 30 days supply | Qty: 30 | Fill #5

## 2016-03-12 MED FILL — VICTOZA 18 MG/3 ML INJECT P: 18 | 30 days supply | Qty: 9 | Fill #2

## 2016-03-15 ENCOUNTER — Other Ambulatory Visit: Payer: Self-pay | Admitting: *Deleted

## 2016-03-15 NOTE — Patient Outreach (Signed)
Returned phone call to patient but unable to leave voice mail as voice mail box was full so keyed in return phone number when option given. Will attempt to call [name] on Monday 03/18/16. Bary RichardJanet S. Hauser RN,CCM,CDE [facility] Care Management Coordinator Link To Wellness Office Phone [phone] Office Fax [phone]

## 2016-04-04 DIAGNOSIS — E1165 Type 2 diabetes mellitus with hyperglycemia: Secondary | ICD-10-CM | POA: Diagnosis not present

## 2016-04-04 DIAGNOSIS — I10 Essential (primary) hypertension: Secondary | ICD-10-CM | POA: Diagnosis not present

## 2016-04-04 DIAGNOSIS — E782 Mixed hyperlipidemia: Secondary | ICD-10-CM | POA: Diagnosis not present

## 2016-04-11 MED FILL — VICTOZA 18 MG/3 ML INJECT P: 18 | 30 days supply | Qty: 9 | Fill #3

## 2016-04-11 MED FILL — LISINOPRIL 5 MG TAB: 5 | 30 days supply | Qty: 30 | Fill #1

## 2016-05-02 MED FILL — PRAVASTATIN NA 40 MG TAB: 40 | 90 days supply | Qty: 90 | Fill #0

## 2016-05-02 MED FILL — metFORMIN HCL 1000 MG TABS: 1000 | 90 days supply | Qty: 180 | Fill #0

## 2016-05-02 MED FILL — BD PEN NDL MINI 31GX5MM: 31G X 5 MM | 90 days supply | Qty: 100 | Fill #1

## 2016-05-10 MED FILL — ACCU-CHEK GUIDE TEST STRIP: 84 days supply | Qty: 500 | Fill #0

## 2016-05-13 MED FILL — VICTOZA 18 MG/3 ML INJECT P: 18 | 30 days supply | Qty: 9 | Fill #4

## 2016-05-14 MED FILL — ACCU-CHEK FASTCLIX LANCETS: 34 days supply | Qty: 204 | Fill #0

## 2016-05-14 MED FILL — LISINOPRIL 5 MG TABLET: 5 | 30 days supply | Qty: 30 | Fill #0

## 2016-06-13 MED FILL — VICTOZA 18 MG/3 ML INJECT P: 18 | 30 days supply | Qty: 9 | Fill #5

## 2016-06-13 MED FILL — LISINOPRIL 5 MG TABLET: 5 | 90 days supply | Qty: 90 | Fill #1

## 2016-07-15 MED FILL — VICTOZA 18 MG/3 ML INJECT P: 18 | 30 days supply | Qty: 9 | Fill #0

## 2016-08-02 MED FILL — metFORMIN HCL 1000 MG TABS: 1000 | 90 days supply | Qty: 180 | Fill #1

## 2016-08-07 DIAGNOSIS — I10 Essential (primary) hypertension: Secondary | ICD-10-CM | POA: Diagnosis not present

## 2016-08-07 DIAGNOSIS — E782 Mixed hyperlipidemia: Secondary | ICD-10-CM | POA: Diagnosis not present

## 2016-08-07 DIAGNOSIS — E1165 Type 2 diabetes mellitus with hyperglycemia: Secondary | ICD-10-CM | POA: Diagnosis not present

## 2016-08-07 MED FILL — PRAVASTATIN NA 40 MG TAB: 40 | 90 days supply | Qty: 90 | Fill #0

## 2016-08-13 MED FILL — VICTOZA 18 MG/3 ML INJECT P: 18 | 90 days supply | Qty: 27 | Fill #0

## 2016-08-13 MED FILL — BD PEN NDL MINI 31GX5MM: 31G X 5 MM | 90 days supply | Qty: 100 | Fill #2

## 2016-08-15 ENCOUNTER — Ambulatory Visit: Payer: Self-pay | Admitting: *Deleted

## 2016-08-15 ENCOUNTER — Other Ambulatory Visit: Payer: Self-pay | Admitting: *Deleted

## 2016-08-15 NOTE — Patient Outreach (Signed)
Met with Kimberly Wilkins to provide her with another [employer] activity tracker and to assist her with pairing her glucometer and scale. Despite multiple attempts, the glucometer would not pair with her phone, but successfully paired her scale and new activity tracker. Reviewed [place] with her and congratulated Kimberly Wilkins on her excellent diabetes management skills. Ongoing DM management will be provided by the [facility] platform with this RNCM serving as her clinical team member. Barrington Ellison RN,CCM,CDE [place] Management Coordinator Link To Wellness and [employer] [phone] Office Fax [phone]

## 2016-08-19 DIAGNOSIS — I10 Essential (primary) hypertension: Secondary | ICD-10-CM | POA: Diagnosis not present

## 2016-08-19 DIAGNOSIS — E1165 Type 2 diabetes mellitus with hyperglycemia: Secondary | ICD-10-CM | POA: Diagnosis not present

## 2016-08-19 DIAGNOSIS — E782 Mixed hyperlipidemia: Secondary | ICD-10-CM | POA: Diagnosis not present

## 2016-09-09 MED FILL — LISINOPRIL 5 MG TAB: 5 | 90 days supply | Qty: 90 | Fill #0

## 2016-09-09 MED FILL — ACCU-CHEK GUIDE TEST STRIP: 84 days supply | Qty: 500 | Fill #1

## 2016-10-10 DIAGNOSIS — H524 Presbyopia: Secondary | ICD-10-CM | POA: Diagnosis not present

## 2016-10-10 DIAGNOSIS — H52223 Regular astigmatism, bilateral: Secondary | ICD-10-CM | POA: Diagnosis not present

## 2016-11-01 MED FILL — VICTOZA 18 MG/3 ML INJECT P: 18 | 30 days supply | Qty: 9 | Fill #1

## 2016-11-01 MED FILL — metFORMIN HCL 1000 MG TABS: 1000 | 90 days supply | Qty: 180 | Fill #2

## 2016-11-11 DIAGNOSIS — M79671 Pain in right foot: Secondary | ICD-10-CM | POA: Diagnosis not present

## 2016-11-11 DIAGNOSIS — M79672 Pain in left foot: Secondary | ICD-10-CM | POA: Diagnosis not present

## 2016-11-11 DIAGNOSIS — B351 Tinea unguium: Secondary | ICD-10-CM | POA: Diagnosis not present

## 2016-11-11 DIAGNOSIS — E119 Type 2 diabetes mellitus without complications: Secondary | ICD-10-CM | POA: Diagnosis not present

## 2016-11-11 MED FILL — TERBINAFINE HCL 250 MG TAB: 250 | 30 days supply | Qty: 30 | Fill #0

## 2016-11-25 MED FILL — BD PEN NDL MINI 31GX5MM: 31G X 5 MM | 90 days supply | Qty: 100 | Fill #3

## 2016-12-12 DIAGNOSIS — E782 Mixed hyperlipidemia: Secondary | ICD-10-CM | POA: Diagnosis not present

## 2016-12-12 DIAGNOSIS — I10 Essential (primary) hypertension: Secondary | ICD-10-CM | POA: Diagnosis not present

## 2016-12-12 DIAGNOSIS — E1165 Type 2 diabetes mellitus with hyperglycemia: Secondary | ICD-10-CM | POA: Diagnosis not present

## 2016-12-12 MED FILL — LISINOPRIL 5 MG TAB: 5 | 90 days supply | Qty: 90 | Fill #1

## 2016-12-12 MED FILL — VICTOZA 18 MG/3 ML INJECT P: 18 | 30 days supply | Qty: 9 | Fill #2

## 2017-01-14 MED FILL — VICTOZA 18 MG/3 ML INJECT P: 18 | 90 days supply | Qty: 27 | Fill #3

## 2017-01-21 MED FILL — PRAVASTATIN NA 40 MG TAB: 40 | 90 days supply | Qty: 90 | Fill #1

## 2017-01-21 MED FILL — metFORMIN HCL 1000 MG TABS: 1000 | 90 days supply | Qty: 180 | Fill #0

## 2017-01-21 MED FILL — ACCU-CHEK GUIDE TEST STRIP: 84 days supply | Qty: 500 | Fill #2

## 2017-02-07 ENCOUNTER — Other Ambulatory Visit: Payer: Self-pay | Admitting: *Deleted

## 2017-02-07 NOTE — Patient Outreach (Signed)
Kimberly Wilkins transitioned from the [employer] [employer] program to the [employer] digital assistant platform on 04/03/16 for Type II diabetes self-management assistance so will close case to the diabetes Link To Wellness program due to delegation of disease management services to [employer] from [employer] for [employer] plan members in 2019. Kimberly RichardJanet S. Hauser RN,CCM,CDE [facility] Care Management Coordinator Link To Wellness and [employer] Office Phone [phone] Office Fax [phone]

## 2017-03-07 MED FILL — BD PEN NDL MINI 31GX5MM: 31G X 5 MM | 90 days supply | Qty: 100 | Fill #0

## 2017-03-18 MED FILL — LISINOPRIL 5 MG TAB: 5 | 90 days supply | Qty: 90 | Fill #2

## 2017-04-10 DIAGNOSIS — E1165 Type 2 diabetes mellitus with hyperglycemia: Secondary | ICD-10-CM | POA: Diagnosis not present

## 2017-04-10 DIAGNOSIS — I10 Essential (primary) hypertension: Secondary | ICD-10-CM | POA: Diagnosis not present

## 2017-04-10 DIAGNOSIS — E782 Mixed hyperlipidemia: Secondary | ICD-10-CM | POA: Diagnosis not present

## 2017-04-15 DIAGNOSIS — Z1231 Encounter for screening mammogram for malignant neoplasm of breast: Secondary | ICD-10-CM | POA: Diagnosis not present

## 2017-04-21 MED FILL — ACCU-CHEK GUIDE TEST STRIP: 25 days supply | Qty: 150 | Fill #3

## 2017-04-21 MED FILL — VICTOZA 18 MG/3 ML INJECT P: 18 | 90 days supply | Qty: 27 | Fill #0

## 2017-04-21 MED FILL — PRAVASTATIN NA 40 MG TAB: 40 | 90 days supply | Qty: 90 | Fill #0

## 2017-05-02 MED FILL — metFORMIN HCL 1000 MG TABS: 1000 | 90 days supply | Qty: 180 | Fill #0

## 2017-05-26 MED FILL — ACCU-CHEK GUIDE STRP: 83 days supply | Qty: 500 | Fill #0

## 2017-06-13 MED FILL — LISINOPRIL 5 MG TABLET: 5 | 90 days supply | Qty: 90 | Fill #3

## 2017-06-18 MED FILL — PENTIPS 31G X 5 MM MISC: 31G X 5 MM | 90 days supply | Qty: 100 | Fill #0

## 2017-07-21 MED FILL — VICTOZA 18 MG/3 ML INJECT P: 18 | 90 days supply | Qty: 27 | Fill #1

## 2017-07-31 MED FILL — metFORMIN HCL 1000 MG TABS: 1000 | 90 days supply | Qty: 180 | Fill #1

## 2017-07-31 MED FILL — PRAVASTATIN NA 40 MG TAB: 40 | 90 days supply | Qty: 90 | Fill #1

## 2017-08-14 DIAGNOSIS — E1165 Type 2 diabetes mellitus with hyperglycemia: Secondary | ICD-10-CM | POA: Diagnosis not present

## 2017-08-14 DIAGNOSIS — Z7984 Long term (current) use of oral hypoglycemic drugs: Secondary | ICD-10-CM | POA: Diagnosis not present

## 2017-08-14 DIAGNOSIS — E782 Mixed hyperlipidemia: Secondary | ICD-10-CM | POA: Diagnosis not present

## 2017-08-14 DIAGNOSIS — L84 Corns and callosities: Secondary | ICD-10-CM | POA: Diagnosis not present

## 2017-08-14 DIAGNOSIS — I10 Essential (primary) hypertension: Secondary | ICD-10-CM | POA: Diagnosis not present

## 2017-08-26 DIAGNOSIS — E1165 Type 2 diabetes mellitus with hyperglycemia: Secondary | ICD-10-CM | POA: Diagnosis not present

## 2017-08-26 DIAGNOSIS — E782 Mixed hyperlipidemia: Secondary | ICD-10-CM | POA: Diagnosis not present

## 2017-08-26 DIAGNOSIS — I10 Essential (primary) hypertension: Secondary | ICD-10-CM | POA: Diagnosis not present

## 2017-09-15 MED FILL — LISINOPRIL 5 MG TABLET: 5 | 90 days supply | Qty: 90 | Fill #0

## 2017-10-02 MED FILL — PENTIPS 31G X 5 MM MISC: 31G X 5 MM | 90 days supply | Qty: 100 | Fill #1

## 2017-10-02 MED FILL — ACCU-CHEK GUIDE STRP: 83 days supply | Qty: 500 | Fill #1

## 2017-10-23 MED FILL — VICTOZA 18 MG/3 ML INJECT P: 18 | 90 days supply | Qty: 27 | Fill #2

## 2017-11-05 MED FILL — PRAVASTATIN NA 40 MG TAB: 40 | 90 days supply | Qty: 90 | Fill #2

## 2017-11-05 MED FILL — metFORMIN HCL 1000 MG TABS: 1000 | 90 days supply | Qty: 180 | Fill #2

## 2017-12-16 MED FILL — LISINOPRIL 5 MG TABLET: 5 | 90 days supply | Qty: 90 | Fill #1

## 2017-12-18 DIAGNOSIS — E1165 Type 2 diabetes mellitus with hyperglycemia: Secondary | ICD-10-CM | POA: Diagnosis not present

## 2017-12-18 DIAGNOSIS — E782 Mixed hyperlipidemia: Secondary | ICD-10-CM | POA: Diagnosis not present

## 2017-12-18 DIAGNOSIS — Z79899 Other long term (current) drug therapy: Secondary | ICD-10-CM | POA: Diagnosis not present

## 2017-12-18 DIAGNOSIS — I10 Essential (primary) hypertension: Secondary | ICD-10-CM | POA: Diagnosis not present

## 2018-01-14 MED FILL — VICTOZA 18 MG/3 ML INJECT P: 18 | 90 days supply | Qty: 27 | Fill #3

## 2018-01-14 MED FILL — PENTIPS 31G X 5 MM MISC: 31G X 5 MM | 90 days supply | Qty: 100 | Fill #2

## 2018-02-03 MED FILL — ACCU-CHEK GUIDE STRP: 83 days supply | Qty: 500 | Fill #2

## 2018-02-03 MED FILL — metFORMIN HCL 1000 MG TABS: 1000 | 90 days supply | Qty: 180 | Fill #3

## 2018-02-03 MED FILL — PRAVASTATIN NA 40 MG TAB: 40 | 90 days supply | Qty: 90 | Fill #3

## 2018-02-04 DIAGNOSIS — H5212 Myopia, left eye: Secondary | ICD-10-CM | POA: Diagnosis not present

## 2018-04-03 MED FILL — LISINOPRIL 5 MG TABLET: 5 | 90 days supply | Qty: 90 | Fill #2

## 2018-04-28 DIAGNOSIS — Z7984 Long term (current) use of oral hypoglycemic drugs: Secondary | ICD-10-CM | POA: Diagnosis not present

## 2018-04-28 DIAGNOSIS — E1165 Type 2 diabetes mellitus with hyperglycemia: Secondary | ICD-10-CM | POA: Diagnosis not present

## 2018-04-28 DIAGNOSIS — E782 Mixed hyperlipidemia: Secondary | ICD-10-CM | POA: Diagnosis not present

## 2018-04-28 DIAGNOSIS — I10 Essential (primary) hypertension: Secondary | ICD-10-CM | POA: Diagnosis not present

## 2018-04-28 MED FILL — BD PEN NDL MINI 31GX5MM: 31G X 5 MM | 90 days supply | Qty: 100 | Fill #0

## 2018-04-28 MED FILL — VICTOZA 18 MG/3 ML INJECT P: 18 | 90 days supply | Qty: 27 | Fill #0

## 2018-04-29 DIAGNOSIS — Z1231 Encounter for screening mammogram for malignant neoplasm of breast: Secondary | ICD-10-CM | POA: Diagnosis not present

## 2018-05-08 MED FILL — metFORMIN HCL 1000 MG TABS: 1000 | 90 days supply | Qty: 180 | Fill #0

## 2018-05-08 MED FILL — PRAVASTATIN NA 40 MG TAB: 40 | 90 days supply | Qty: 90 | Fill #0

## 2018-05-19 MED FILL — ACCU-CHEK GUIDE STRP: 30 days supply | Qty: 200 | Fill #0

## 2018-07-06 MED FILL — LISINOPRIL 5 MG TABLET: 5 | 90 days supply | Qty: 90 | Fill #3

## 2018-08-05 MED FILL — BD PEN NDL MINI 31GX5MM: 31G X 5 MM | 90 days supply | Qty: 100 | Fill #1

## 2018-08-05 MED FILL — VICTOZA 18 MG/3 ML INJECT P: 18 | 90 days supply | Qty: 27 | Fill #1

## 2018-08-05 MED FILL — metFORMIN HCL 1000 MG TABS: 1000 | 90 days supply | Qty: 180 | Fill #1

## 2018-08-27 DIAGNOSIS — E1165 Type 2 diabetes mellitus with hyperglycemia: Secondary | ICD-10-CM | POA: Diagnosis not present

## 2018-08-27 DIAGNOSIS — E782 Mixed hyperlipidemia: Secondary | ICD-10-CM | POA: Diagnosis not present

## 2018-08-27 DIAGNOSIS — I10 Essential (primary) hypertension: Secondary | ICD-10-CM | POA: Diagnosis not present

## 2018-08-27 MED FILL — PRAVASTATIN NA 40 MG TAB: 40 | 90 days supply | Qty: 90 | Fill #0

## 2018-08-27 MED FILL — ACCU-CHEK GUIDE TEST STRIP: 90 days supply | Qty: 550 | Fill #0

## 2018-09-02 DIAGNOSIS — I10 Essential (primary) hypertension: Secondary | ICD-10-CM | POA: Diagnosis not present

## 2018-09-02 DIAGNOSIS — E782 Mixed hyperlipidemia: Secondary | ICD-10-CM | POA: Diagnosis not present

## 2018-09-02 DIAGNOSIS — E1165 Type 2 diabetes mellitus with hyperglycemia: Secondary | ICD-10-CM | POA: Diagnosis not present

## 2018-09-09 MED FILL — ACCU-CHEK GUIDE TEST STRIP: 58 days supply | Qty: 350 | Fill #0

## 2018-09-09 MED FILL — PRAVASTATIN NA 40 MG TAB: 40 | 90 days supply | Qty: 90 | Fill #1

## 2018-10-16 MED FILL — LISINOPRIL 5 MG TABLET: 5 | 90 days supply | Qty: 90 | Fill #0

## 2018-11-06 MED FILL — VICTOZA 18 MG/3 ML INJECT P: 18 | 90 days supply | Qty: 27 | Fill #2

## 2018-11-06 MED FILL — metFORMIN HCL 1000 MG TABS: 1000 | 90 days supply | Qty: 180 | Fill #2

## 2018-11-25 MED FILL — PENTIPS 31G X 5 MM MISC: 31G X 5 MM | 90 days supply | Qty: 100 | Fill #0

## 2018-12-15 MED FILL — PRAVASTATIN NA 40 MG TAB: 40 | 90 days supply | Qty: 90 | Fill #2

## 2018-12-24 DIAGNOSIS — I10 Essential (primary) hypertension: Secondary | ICD-10-CM | POA: Diagnosis not present

## 2018-12-24 DIAGNOSIS — E782 Mixed hyperlipidemia: Secondary | ICD-10-CM | POA: Diagnosis not present

## 2018-12-24 DIAGNOSIS — E1165 Type 2 diabetes mellitus with hyperglycemia: Secondary | ICD-10-CM | POA: Diagnosis not present

## 2018-12-24 MED FILL — OZEMPIC 1 MG/DOSE SOPN: 2 | 28 days supply | Qty: 3 | Fill #0

## 2019-02-04 MED FILL — LISINOPRIL 5 MG TABLET: 5 | 90 days supply | Qty: 90 | Fill #1

## 2019-02-04 MED FILL — metFORMIN HCL 1000 MG TABS: 1000 | 90 days supply | Qty: 180 | Fill #3

## 2019-02-04 MED FILL — VICTOZA 18 MG/3 ML INJECT P: 18 | 90 days supply | Qty: 27 | Fill #3

## 2019-02-04 MED FILL — ACCU-CHEK GUIDE TEST STRIP: 58 days supply | Qty: 350 | Fill #1

## 2019-02-05 MED FILL — PENTIPS 31G X 5 MM MISC: 31G X 5 MM | 90 days supply | Qty: 100 | Fill #1

## 2019-03-11 MED FILL — PENTIPS 31G X 5 MM MISC: 31G X 5 MM | 90 days supply | Qty: 100 | Fill #1

## 2019-04-01 ENCOUNTER — Other Ambulatory Visit (HOSPITAL_BASED_OUTPATIENT_CLINIC_OR_DEPARTMENT_OTHER): Payer: Self-pay | Admitting: Internal Medicine

## 2019-04-01 DIAGNOSIS — E1165 Type 2 diabetes mellitus with hyperglycemia: Secondary | ICD-10-CM | POA: Diagnosis not present

## 2019-04-01 DIAGNOSIS — E782 Mixed hyperlipidemia: Secondary | ICD-10-CM | POA: Diagnosis not present

## 2019-04-01 DIAGNOSIS — I10 Essential (primary) hypertension: Secondary | ICD-10-CM | POA: Diagnosis not present

## 2019-04-01 MED FILL — PRAVASTATIN NA 40 MG TAB: 40 | 90 days supply | Qty: 90 | Fill #0

## 2019-05-07 MED FILL — metFORMIN HCL 1000 MG TABS: 1000 | 90 days supply | Qty: 180 | Fill #0

## 2019-05-07 MED FILL — FREESTYLE LITE METER: 1 days supply | Qty: 1 | Fill #0

## 2019-05-07 MED FILL — VICTOZA 18 MG/3 ML INJECT P: 18 | 90 days supply | Qty: 27 | Fill #0

## 2019-05-07 MED FILL — FREESTYLE LITE TEST STRIP: 66 days supply | Qty: 400 | Fill #2

## 2019-05-07 MED FILL — LISINOPRIL 5 MG TABLET: 5 | 90 days supply | Qty: 90 | Fill #2

## 2019-05-17 DIAGNOSIS — M65341 Trigger finger, right ring finger: Secondary | ICD-10-CM | POA: Diagnosis not present

## 2019-05-17 DIAGNOSIS — M79641 Pain in right hand: Secondary | ICD-10-CM | POA: Diagnosis not present

## 2019-05-18 DIAGNOSIS — Z1231 Encounter for screening mammogram for malignant neoplasm of breast: Secondary | ICD-10-CM | POA: Diagnosis not present

## 2019-06-18 MED FILL — PENTIPS 31G X 5 MM MISC: 31G X 5 MM | 90 days supply | Qty: 100 | Fill #0

## 2019-07-07 DIAGNOSIS — M79671 Pain in right foot: Secondary | ICD-10-CM | POA: Diagnosis not present

## 2019-07-07 DIAGNOSIS — M79672 Pain in left foot: Secondary | ICD-10-CM | POA: Diagnosis not present

## 2019-07-07 DIAGNOSIS — B351 Tinea unguium: Secondary | ICD-10-CM | POA: Diagnosis not present

## 2019-07-07 DIAGNOSIS — E119 Type 2 diabetes mellitus without complications: Secondary | ICD-10-CM | POA: Diagnosis not present

## 2019-07-07 MED FILL — TERBINAFINE HCL 250 MG TAB: 250 | 30 days supply | Qty: 30 | Fill #0

## 2019-07-22 ENCOUNTER — Other Ambulatory Visit (HOSPITAL_BASED_OUTPATIENT_CLINIC_OR_DEPARTMENT_OTHER): Payer: Self-pay | Admitting: Internal Medicine

## 2019-07-22 DIAGNOSIS — I10 Essential (primary) hypertension: Secondary | ICD-10-CM | POA: Diagnosis not present

## 2019-07-22 DIAGNOSIS — E1165 Type 2 diabetes mellitus with hyperglycemia: Secondary | ICD-10-CM | POA: Diagnosis not present

## 2019-07-22 DIAGNOSIS — E782 Mixed hyperlipidemia: Secondary | ICD-10-CM | POA: Diagnosis not present

## 2019-07-22 MED FILL — VICTOZA 18 MG/3 ML INJECT P: 18 | 90 days supply | Qty: 27 | Fill #0

## 2019-07-22 MED FILL — METFORMIN HCL 1000 MG TABS: 1000 | 90 days supply | Qty: 180 | Fill #0

## 2019-08-04 MED FILL — PRAVASTATIN NA 40 MG TAB: 40 | 90 days supply | Qty: 90 | Fill #1

## 2019-08-04 MED FILL — METFORMIN HCL 1000 MG TABS: 1000 | 90 days supply | Qty: 180 | Fill #0

## 2019-08-04 MED FILL — VICTOZA 18 MG/3 ML INJECT P: 18 | 90 days supply | Qty: 27 | Fill #0

## 2019-08-04 MED FILL — LISINOPRIL 5 MG TABLET: 5 | 90 days supply | Qty: 90 | Fill #3

## 2019-08-06 DIAGNOSIS — I10 Essential (primary) hypertension: Secondary | ICD-10-CM | POA: Diagnosis not present

## 2019-08-06 DIAGNOSIS — E782 Mixed hyperlipidemia: Secondary | ICD-10-CM | POA: Diagnosis not present

## 2019-08-06 DIAGNOSIS — E1165 Type 2 diabetes mellitus with hyperglycemia: Secondary | ICD-10-CM | POA: Diagnosis not present

## 2019-08-20 ENCOUNTER — Other Ambulatory Visit (HOSPITAL_BASED_OUTPATIENT_CLINIC_OR_DEPARTMENT_OTHER): Payer: Self-pay | Admitting: Internal Medicine

## 2019-08-20 MED FILL — FREESTYLE LITE TEST STRIP: 67 days supply | Qty: 400 | Fill #0

## 2019-08-20 MED FILL — TERBINAFINE HCL 250 MG TAB: 250 | 30 days supply | Qty: 30 | Fill #0

## 2019-08-23 DIAGNOSIS — M65341 Trigger finger, right ring finger: Secondary | ICD-10-CM | POA: Diagnosis not present

## 2019-08-23 DIAGNOSIS — M79641 Pain in right hand: Secondary | ICD-10-CM | POA: Diagnosis not present

## 2019-09-12 ENCOUNTER — Encounter (HOSPITAL_BASED_OUTPATIENT_CLINIC_OR_DEPARTMENT_OTHER): Payer: Self-pay

## 2019-09-12 ENCOUNTER — Emergency Department (HOSPITAL_BASED_OUTPATIENT_CLINIC_OR_DEPARTMENT_OTHER): Payer: 59

## 2019-09-12 ENCOUNTER — Other Ambulatory Visit: Payer: Self-pay

## 2019-09-12 ENCOUNTER — Emergency Department (HOSPITAL_BASED_OUTPATIENT_CLINIC_OR_DEPARTMENT_OTHER)
Admission: EM | Admit: 2019-09-12 | Discharge: 2019-09-12 | Disposition: A | Discharge status: Home / Self Care | Payer: 59 | Attending: Emergency Medicine | Admitting: Emergency Medicine

## 2019-09-12 DIAGNOSIS — S6992XA Unspecified injury of left wrist, hand and finger(s), initial encounter: Secondary | ICD-10-CM | POA: Diagnosis present

## 2019-09-12 DIAGNOSIS — Y9301 Activity, walking, marching and hiking: Secondary | ICD-10-CM | POA: Diagnosis not present

## 2019-09-12 DIAGNOSIS — S62112A Displaced fracture of triquetrum [cuneiform] bone, left wrist, initial encounter for closed fracture: Secondary | ICD-10-CM | POA: Diagnosis not present

## 2019-09-12 DIAGNOSIS — S6292XA Unspecified fracture of left wrist and hand, initial encounter for closed fracture: Secondary | ICD-10-CM | POA: Diagnosis not present

## 2019-09-12 DIAGNOSIS — W010XXA Fall on same level from slipping, tripping and stumbling without subsequent striking against object, initial encounter: Secondary | ICD-10-CM | POA: Diagnosis not present

## 2019-09-12 DIAGNOSIS — S62115A Nondisplaced fracture of triquetrum [cuneiform] bone, left wrist, initial encounter for closed fracture: Secondary | ICD-10-CM | POA: Diagnosis not present

## 2019-09-12 DIAGNOSIS — M7989 Other specified soft tissue disorders: Secondary | ICD-10-CM | POA: Diagnosis not present

## 2019-09-12 DIAGNOSIS — E119 Type 2 diabetes mellitus without complications: Secondary | ICD-10-CM | POA: Insufficient documentation

## 2019-09-12 DIAGNOSIS — Y999 Unspecified external cause status: Secondary | ICD-10-CM | POA: Insufficient documentation

## 2019-09-12 DIAGNOSIS — M5136 Other intervertebral disc degeneration, lumbar region: Secondary | ICD-10-CM | POA: Diagnosis not present

## 2019-09-12 DIAGNOSIS — Y9289 Other specified places as the place of occurrence of the external cause: Secondary | ICD-10-CM | POA: Diagnosis not present

## 2019-09-12 MED ORDER — OXYCODONE-ACETAMINOPHEN 5-325 MG PO TABS: 1.0000 | ORAL_TABLET | Freq: Four times a day (QID) | ORAL | 0 refills | Status: DC | PRN

## 2019-09-12 MED ORDER — OXYCODONE-ACETAMINOPHEN 5-325 MG PO TABS
1.0000 | ORAL_TABLET | Freq: Once | ORAL | Status: AC
  Administered 2019-09-12: 1 via ORAL
  Filled 2019-09-12: qty 1

## 2019-09-12 NOTE — ED Provider Notes (Signed)
[facility] EMERGENCY DEPARTMENT Provider Note   CSN: [id number] Arrival date & time: 09/12/19  1621     History Chief Complaint  Patient presents with  . Fall    Kimberly Wilkins is a 59 y.o. female possible history of diabetes, obesity who presents for evaluation of left wrist pain after mechanical fall that occurred just prior to ED arrival.  She reports that she was walking and states that she tripped over her shoes, causing her to fall.  She thinks that she landed with her arm outstretched.  Since then, she has had pain, swelling noted to left wrist.  She took ibuprofen with minimal improvement in pain.  She is not on any blood thinners.  She denies any numbness/weakness.  Does not have any head injury or LOC.  She denies any neck pain.  The history is provided by the patient.       Past Medical History:  Diagnosis Date  . Diabetes mellitus without complication (HCC)   . Obesity     There are no problems to display for this patient.   Past Surgical History:  Procedure Laterality Date  . ABDOMINAL HYSTERECTOMY       OB History   No obstetric history on file.     Family History  Problem Relation Age of Onset  . Diabetes Sister     Social History   Tobacco Use  . Smoking status: Never Smoker  . Smokeless tobacco: Never Used  Substance Use Topics  . Alcohol use: No    Alcohol/week: 0.0 standard drinks  . Drug use: Not Currently    Home Medications Prior to Admission medications   Medication Sig Start Date End Date Taking? Authorizing Provider  liraglutide (VICTOZA) 18 MG/3ML SOPN Victoza 3-Pak 0.6 mg/0.1 mL (18 mg/3 mL) subcutaneous pen injector  INJECT 0.3 MLS (1.8 MG TOTAL) INTO THE SKIN DAILY. 07/22/19  Yes [provider]  aspirin 81 MG tablet Take 81 mg by mouth daily. Reported on 08/29/2015    [provider]  lisinopril (PRINIVIL,ZESTRIL) 2.5 MG tablet Take 2.5 mg by mouth daily.    [provider]  metFORMIN  (GLUMETZA) 500 MG (MOD) 24 hr tablet Take 1,000 mg by mouth 2 (two) times daily with a meal.     [provider]  oxyCODONE-acetaminophen (PERCOCET/ROXICET) 5-325 MG tablet Take 1-2 tablets by mouth every 6 (six) hours as needed for severe pain. 09/12/19   Maxwell Caul, PA-C  pravastatin (PRAVACHOL) 40 MG tablet Take 40 mg by mouth daily.    [provider]  sitaGLIPtin (JANUVIA) 100 MG tablet Take 100 mg by mouth daily.    [provider]    Allergies    Sulfa antibiotics  Review of Systems   Review of Systems  Musculoskeletal:       Left wrist pain  Neurological: Negative for weakness and numbness.  All other systems reviewed and are negative.   Physical Exam Updated Vital Signs BP (!) 155/73 (BP Location: Left Arm)   Pulse 78   Temp 98.1 F (36.7 C) (Oral)   Resp 16   Ht 5\' 7"  (1.702 m)   Wt 87.1 kg   SpO2 100%   BMI 30.07 kg/m   Physical Exam Vitals and nursing note reviewed.  Constitutional:      Appearance: She is well-developed.  HENT:     Head: Normocephalic and atraumatic.  Eyes:     General: No scleral icterus.  Right eye: No discharge.        Left eye: No discharge.     Conjunctiva/sclera: Conjunctivae normal.  Cardiovascular:     Pulses:          Radial pulses are 2+ on the right side and 2+ on the left side.  Pulmonary:     Effort: Pulmonary effort is normal.  Musculoskeletal:     Comments: Tenderness palpation noted to the volar ulnar aspect of the left wrist with some overlying soft tissue swelling.  Flexion/tension intact but does report pain when doing so.  She can fully move all 5 digits of her left hand without any difficulty.  No snuffbox tenderness.  No tenderness palpation to left forearm, left elbow, shoulder.  No tenderness palpation of the right upper extremity.  Skin:    General: Skin is warm and dry.     Comments: Good distal cap refill.  LUE is not dusky in appearance or cool to touch.  Neurological:      Mental Status: She is alert.     Comments: Sensation intact along major nerve distributions of LUE  Psychiatric:        Speech: Speech normal.        Behavior: Behavior normal.     ED Results / Procedures / Treatments   Labs (all labs ordered are listed, but only abnormal results are displayed) Labs Reviewed - No data to display  EKG None  Radiology DG Wrist Complete Left  Result Date: 09/12/2019 CLINICAL DATA:  Pain status post fall EXAM: LEFT WRIST - COMPLETE 3+ VIEW COMPARISON:  None. FINDINGS: There is an acute triquetral fracture. There is surrounding soft tissue swelling. No dislocation. There are degenerative changes at the first carpometacarpal joint. IMPRESSION: 1. Acute triquetral fracture with surrounding soft tissue swelling. 2. Degenerative changes at the first carpometacarpal joint. Electronically Signed   By: Katherine Mantle M.D.   On: 09/12/2019 17:12    Procedures Procedures (including critical care time)  Medications Ordered in ED Medications  oxyCODONE-acetaminophen (PERCOCET/ROXICET) 5-325 MG per tablet 1 tablet (1 tablet Oral Given 09/12/19 1704)    ED Course  I have reviewed the triage vital signs and the nursing notes.  Pertinent labs & imaging results that were available during my care of the patient were reviewed by me and considered in my medical decision making (see chart for details).    MDM Rules/Calculators/A&P                          59 year old female who presents for evaluation of left wrist pain status post mechanical fall that occurred earlier today.  No head injury, LOC.  She is not on blood thinners.  On initial arrival, she is afebrile, nontoxic-appearing.  Vital signs are stable.  She is neurovascular intact.  On exam, she has tenderness palpation, soft tissue swelling noted to left upper extremity.  Concern for fracture versus dislocation versus sprain.  X-rays ordered at triage.  X-ray reviewed shows an acute triquetral fracture  with surrounding soft tissue swelling.  Discussed patient with Dr. Eulah Pont (Ortho - Hand).  He recommends putting patient in a thumb spica brace as well as obtaining a CT scan. Patient can follow up on an outpatient basis.   Discussed results with patient.  Reevaluation after splint placement.  Patient with good distal sensation and cap refill.  Instructed patient to follow-up with Ortho as directed.  Encouraged at home supportive care measures.  Patient  reviewed on PMP.  Will give short course of pain medication for acute pain. At this time, patient exhibits no emergent life-threatening condition that require further evaluation in ED or admission. Patient had ample opportunity for questions and discussion. All patient's questions were answered with full understanding. Strict return precautions discussed. Patient expresses understanding and agreement to plan.   Portions of this note were generated with Scientist, clinical (histocompatibility and immunogenetics). Dictation errors may occur despite best attempts at proofreading.  Final Clinical Impression(s) / ED Diagnoses Final diagnoses:  Nondisplaced fracture of triquetrum (cuneiform) bone, left wrist, initial encounter for closed fracture    Rx / DC Orders ED Discharge Orders         Ordered    oxyCODONE-acetaminophen (PERCOCET/ROXICET) 5-325 MG tablet  Every 6 hours PRN     Discontinue  Reprint     09/12/19 1842           Maxwell Caul, PA-C 09/12/19 1852    Sabas Sous, MD 09/12/19 2340

## 2019-09-12 NOTE — ED Triage Notes (Signed)
Pt arrives ambulatory to ED after a fall today states she was twisting to walk between two cars and fell landing on her left hand.

## 2019-09-12 NOTE — Discharge Instructions (Addendum)
You can take Tylenol or Ibuprofen as directed for pain. You can alternate Tylenol and Ibuprofen every 4 hours. If you take Tylenol at 1pm, then you can take Ibuprofen at 5pm. Then you can take Tylenol again at 9pm.   Take pain medications as directed for break through pain. Do not drive or operate machinery while taking this medication.   Follow the RICE (Rest, Ice, Compression, Elevation) protocol as directed.   As we discussed, follow-up with referred orthopedic doctor.  Call his office tomorrow to arrange outpatient follow-up appointment.  Additionally, you can call Dr. Carlos Levering office to see if he can continue.  Return emergency department for any worsening pain, numbness/weakness, discoloration of the fingers or any other worsening concerning symptoms.

## 2019-09-12 NOTE — ED Triage Notes (Signed)
Pt states she took 800 mg  ibuprofen around 1 pm today.

## 2019-09-15 DIAGNOSIS — S62112A Displaced fracture of triquetrum [cuneiform] bone, left wrist, initial encounter for closed fracture: Secondary | ICD-10-CM | POA: Diagnosis not present

## 2019-09-15 DIAGNOSIS — M25532 Pain in left wrist: Secondary | ICD-10-CM | POA: Diagnosis not present

## 2019-10-05 MED FILL — PENTIPS 31G X 5 MM MISC: 31G X 5 MM | 90 days supply | Qty: 100 | Fill #1

## 2019-10-06 DIAGNOSIS — M25532 Pain in left wrist: Secondary | ICD-10-CM | POA: Diagnosis not present

## 2019-10-06 DIAGNOSIS — S62112D Displaced fracture of triquetrum [cuneiform] bone, left wrist, subsequent encounter for fracture with routine healing: Secondary | ICD-10-CM | POA: Diagnosis not present

## 2019-10-13 MED FILL — CLINDAMYCIN HCL 150 MG CAPS: 150 | 7 days supply | Qty: 21 | Fill #0

## 2019-11-19 MED FILL — PRAVASTATIN NA 40 MG TAB: 40 | 90 days supply | Qty: 90 | Fill #2

## 2019-11-19 MED FILL — VICTOZA 18 MG/3 ML INJECT P: 18 | 90 days supply | Qty: 27 | Fill #1

## 2019-11-19 MED FILL — LISINOPRIL 5 MG TABLET: 5 | 90 days supply | Qty: 90 | Fill #0

## 2019-12-24 MED FILL — METFORMIN HCL 1000 MG TABS: 1000 | 90 days supply | Qty: 180 | Fill #1

## 2019-12-24 MED FILL — FREESTYLE LITE TEST STRIP: 67 days supply | Qty: 400 | Fill #1

## 2020-01-21 MED FILL — PENTIPS 31G X 5 MM MISC: 31G X 5 MM | 90 days supply | Qty: 100 | Fill #2

## 2020-01-25 ENCOUNTER — Other Ambulatory Visit (HOSPITAL_BASED_OUTPATIENT_CLINIC_OR_DEPARTMENT_OTHER): Payer: Self-pay | Admitting: Internal Medicine

## 2020-01-25 DIAGNOSIS — I10 Essential (primary) hypertension: Secondary | ICD-10-CM | POA: Diagnosis not present

## 2020-01-25 DIAGNOSIS — E782 Mixed hyperlipidemia: Secondary | ICD-10-CM | POA: Diagnosis not present

## 2020-01-25 DIAGNOSIS — E1165 Type 2 diabetes mellitus with hyperglycemia: Secondary | ICD-10-CM | POA: Diagnosis not present

## 2020-02-02 DIAGNOSIS — M65341 Trigger finger, right ring finger: Secondary | ICD-10-CM | POA: Diagnosis not present

## 2020-02-02 DIAGNOSIS — S62112D Displaced fracture of triquetrum [cuneiform] bone, left wrist, subsequent encounter for fracture with routine healing: Secondary | ICD-10-CM | POA: Diagnosis not present

## 2020-02-15 DIAGNOSIS — H524 Presbyopia: Secondary | ICD-10-CM | POA: Diagnosis not present

## 2020-02-15 DIAGNOSIS — H35033 Hypertensive retinopathy, bilateral: Secondary | ICD-10-CM | POA: Diagnosis not present

## 2020-02-15 DIAGNOSIS — E119 Type 2 diabetes mellitus without complications: Secondary | ICD-10-CM | POA: Diagnosis not present

## 2020-02-29 MED FILL — VICTOZA 18 MG/3 ML INJECT P: 18 | 30 days supply | Qty: 9 | Fill #0

## 2020-02-29 MED FILL — LISINOPRIL 5 MG TABLET: 5 | 30 days supply | Qty: 30 | Fill #1

## 2020-02-29 MED FILL — PRAVASTATIN NA 40 MG TAB: 40 | 30 days supply | Qty: 30 | Fill #3

## 2020-03-28 MED FILL — VICTOZA 18 MG/3 ML INJECT P: 18 | 90 days supply | Qty: 27 | Fill #1

## 2020-03-28 MED FILL — PRAVASTATIN NA 40 MG TAB: 40 | 30 days supply | Qty: 30 | Fill #4

## 2020-03-28 MED FILL — METFORMIN HCL 1000 MG TABS: 1000 | 90 days supply | Qty: 180 | Fill #2

## 2020-03-28 MED FILL — LISINOPRIL 5 MG TABLET: 5 | 90 days supply | Qty: 90 | Fill #2

## 2020-05-08 MED FILL — PRAVASTATIN NA 40 MG TAB: 40 | 90 days supply | Qty: 90 | Fill #0

## 2020-05-08 MED FILL — PENTIPS 31G X 5 MM MISC: 31G X 5 MM | 90 days supply | Qty: 100 | Fill #0

## 2020-05-19 DIAGNOSIS — Z1231 Encounter for screening mammogram for malignant neoplasm of breast: Secondary | ICD-10-CM | POA: Diagnosis not present

## 2020-06-02 DIAGNOSIS — R928 Other abnormal and inconclusive findings on diagnostic imaging of breast: Secondary | ICD-10-CM | POA: Diagnosis not present

## 2020-06-21 DIAGNOSIS — R928 Other abnormal and inconclusive findings on diagnostic imaging of breast: Secondary | ICD-10-CM | POA: Diagnosis not present

## 2020-06-29 ENCOUNTER — Other Ambulatory Visit (HOSPITAL_BASED_OUTPATIENT_CLINIC_OR_DEPARTMENT_OTHER): Payer: Self-pay

## 2020-06-29 MED FILL — Liraglutide Soln Pen-injector 18 MG/3ML (6 MG/ML): SUBCUTANEOUS | 60 days supply | Qty: 18 | Fill #0 | Status: AC

## 2020-06-29 MED FILL — Glucose Blood Test Strip: 67 days supply | Qty: 400 | Fill #0 | Status: AC

## 2020-06-29 MED FILL — Metformin HCl Tab 1000 MG: ORAL | 90 days supply | Qty: 180 | Fill #0 | Status: AC

## 2020-06-30 ENCOUNTER — Other Ambulatory Visit (HOSPITAL_BASED_OUTPATIENT_CLINIC_OR_DEPARTMENT_OTHER): Payer: Self-pay

## 2020-06-30 MED ORDER — LISINOPRIL 5 MG PO TABS
5.0000 mg | ORAL_TABLET | Freq: Every day | ORAL | 3 refills | Status: DC
  Filled 2020-06-30: qty 90, 90d supply, fill #0

## 2020-07-03 ENCOUNTER — Other Ambulatory Visit (HOSPITAL_BASED_OUTPATIENT_CLINIC_OR_DEPARTMENT_OTHER): Payer: Self-pay

## 2020-07-05 ENCOUNTER — Other Ambulatory Visit (HOSPITAL_BASED_OUTPATIENT_CLINIC_OR_DEPARTMENT_OTHER): Payer: Self-pay

## 2020-07-06 ENCOUNTER — Other Ambulatory Visit (HOSPITAL_BASED_OUTPATIENT_CLINIC_OR_DEPARTMENT_OTHER): Payer: Self-pay

## 2020-07-18 ENCOUNTER — Ambulatory Visit (INDEPENDENT_AMBULATORY_CARE_PROVIDER_SITE_OTHER): Payer: 59 | Admitting: Family Medicine

## 2020-07-18 ENCOUNTER — Encounter: Payer: Self-pay | Admitting: Family Medicine

## 2020-07-18 ENCOUNTER — Other Ambulatory Visit (HOSPITAL_BASED_OUTPATIENT_CLINIC_OR_DEPARTMENT_OTHER): Payer: Self-pay

## 2020-07-18 ENCOUNTER — Other Ambulatory Visit: Payer: Self-pay

## 2020-07-18 VITALS — BP 142/78 | HR 75 | Temp 97.8°F | Ht 65.0 in | Wt 213.4 lb

## 2020-07-18 DIAGNOSIS — I10 Essential (primary) hypertension: Secondary | ICD-10-CM

## 2020-07-18 DIAGNOSIS — E1165 Type 2 diabetes mellitus with hyperglycemia: Secondary | ICD-10-CM | POA: Insufficient documentation

## 2020-07-18 DIAGNOSIS — E7800 Pure hypercholesterolemia, unspecified: Secondary | ICD-10-CM | POA: Insufficient documentation

## 2020-07-18 DIAGNOSIS — E119 Type 2 diabetes mellitus without complications: Secondary | ICD-10-CM | POA: Diagnosis not present

## 2020-07-18 DIAGNOSIS — Z0000 Encounter for general adult medical examination without abnormal findings: Secondary | ICD-10-CM | POA: Diagnosis not present

## 2020-07-18 DIAGNOSIS — Z794 Long term (current) use of insulin: Secondary | ICD-10-CM | POA: Insufficient documentation

## 2020-07-18 LAB — URINALYSIS, ROUTINE W REFLEX MICROSCOPIC
Bilirubin Urine: NEGATIVE
Hgb urine dipstick: NEGATIVE
Ketones, ur: NEGATIVE
Leukocytes,Ua: NEGATIVE
Nitrite: NEGATIVE
RBC / HPF: NONE SEEN (ref 0–?)
Specific Gravity, Urine: 1.025 (ref 1.000–1.030)
Total Protein, Urine: NEGATIVE
Urine Glucose: NEGATIVE
Urobilinogen, UA: 0.2 (ref 0.0–1.0)
pH: 6 (ref 5.0–8.0)

## 2020-07-18 LAB — COMPREHENSIVE METABOLIC PANEL
ALT: 27 U/L (ref 0–35)
AST: 22 U/L (ref 0–37)
Albumin: 4.2 g/dL (ref 3.5–5.2)
Alkaline Phosphatase: 94 U/L (ref 39–117)
BUN: 8 mg/dL (ref 6–23)
CO2: 27 mEq/L (ref 19–32)
Calcium: 8.9 mg/dL (ref 8.4–10.5)
Chloride: 104 mEq/L (ref 96–112)
Creatinine, Ser: 0.47 mg/dL (ref 0.40–1.20)
GFR: 102.19 mL/min (ref >60.00)
Glucose, Bld: 152 mg/dL — ABNORMAL HIGH (ref 70–99)
Potassium: 4 mEq/L (ref 3.5–5.1)
Sodium: 139 mEq/L (ref 135–145)
Total Bilirubin: 0.4 mg/dL (ref 0.2–1.2)
Total Protein: 7.1 g/dL (ref 6.0–8.3)

## 2020-07-18 LAB — MICROALBUMIN / CREATININE URINE RATIO
Creatinine,U: 90.7 mg/dL
Microalb Creat Ratio: 1.4 mg/g (ref 0.0–30.0)
Microalb, Ur: 1.3 mg/dL (ref 0.0–1.9)

## 2020-07-18 LAB — LIPID PANEL
Cholesterol: 116 mg/dL (ref 0–200)
HDL: 45.6 mg/dL (ref >39.00)
LDL Cholesterol: 64 mg/dL (ref 0–99)
NonHDL: 70.8
Total CHOL/HDL Ratio: 3
Triglycerides: 36 mg/dL (ref 0.0–149.0)
VLDL: 7.2 mg/dL (ref 0.0–40.0)

## 2020-07-18 LAB — CBC
HCT: 39.6 % (ref 36.0–46.0)
Hemoglobin: 13.5 g/dL (ref 12.0–15.0)
MCHC: 34.1 g/dL (ref 30.0–36.0)
MCV: 79.9 fl (ref 78.0–100.0)
Platelets: 327 10*3/uL (ref 150.0–400.0)
RBC: 4.95 Mil/uL (ref 3.87–5.11)
RDW: 14.3 % (ref 11.5–15.5)
WBC: 5.6 10*3/uL (ref 4.0–10.5)

## 2020-07-18 LAB — HEMOGLOBIN A1C: Hgb A1c MFr Bld: 7.6 % — ABNORMAL HIGH (ref 4.6–6.5)

## 2020-07-18 MED ORDER — LISINOPRIL 10 MG PO TABS
10.0000 mg | ORAL_TABLET | Freq: Every day | ORAL | 3 refills | Status: AC
  Filled 2020-07-18 – 2021-01-29 (×2): qty 90, 90d supply, fill #0
  Filled 2021-04-23: qty 90, 90d supply, fill #1

## 2020-07-18 NOTE — Progress Notes (Signed)
New Patient Office Visit  Subjective:  Patient ID: Kimberly Wilkins, female    DOB: [birth year]  Age: 60 y.o. MRN: [id number]  CC:  Chief Complaint  Patient presents with  . Establish Care    NP/establish care, no concerns. Patient fasting.     HPI Kimberly Wilkins presents for establishment of primary care.  [place]to for she is just been seeing her endocrinologist Dr. Allena Katz and going to urgent care for other health issues.  Recent mammogram was suspicious for calcified lesion that fortunately was benign after biopsy.  Patient is due for Pap smear with her GYN doctor.  She sees Dr. Allena Katz for her diabetes care.  That was reviewed in the record.  Recent dilated pupil checked back in December of this year.  She is due for a colonoscopy.  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  She lives with her sister and her daughter.  She does not smoke drink or use illicit drugs.  Past Medical History:  Diagnosis Date  . Diabetes mellitus without complication (HCC)   . Obesity     Past Surgical History:  Procedure Laterality Date  . ABDOMINAL HYSTERECTOMY      Family History  Problem Relation Age of Onset  . Diabetes Sister     Social History   Socioeconomic History  . Marital status: Single    Spouse name: Not on file  . Number of children: Not on file  . Years of education: Not on file  . Highest education level: Not on file  Occupational History  . Not on file  Tobacco Use  . Smoking status: Never Smoker  . Smokeless tobacco: Never Used  Vaping Use  . Vaping Use: Never used  Substance and Sexual Activity  . Alcohol use: No    Alcohol/week: 0.0 standard drinks  . Drug use: Not Currently  . Sexual activity: Not on file  Other Topics Concern  . Not on file  Social History Narrative  . Not on file   Social Determinants of Health   Financial Resource Strain: Not on file  Food Insecurity: Not on file  Transportation Needs: Not  on file  Physical Activity: Not on file  Stress: Not on file  Social Connections: Not on file  Intimate Partner Violence: Not on file    ROS Review of Systems  Constitutional: Negative.   HENT: Negative.   Eyes: Negative for photophobia and visual disturbance.  Respiratory: Negative.   Cardiovascular: Negative.   Gastrointestinal: Negative.   Endocrine: Negative for polyphagia and polyuria.  Genitourinary: Negative.   Musculoskeletal: Negative for gait problem and joint swelling.  Neurological: Negative for weakness and numbness.  Psychiatric/Behavioral: Negative.     Objective:   Today's Vitals: BP (!) 142/78   Pulse 75   Temp 97.8 F (36.6 C) (Temporal)   Ht 5\' 5"  (1.651 m)   Wt 213 lb 6.4 oz (96.8 kg)   SpO2 98%   BMI 35.51 kg/m   Physical Exam Vitals and nursing note reviewed.  Constitutional:      General: She is not in acute distress.    Appearance: Normal appearance. She is not ill-appearing, toxic-appearing or diaphoretic.  HENT:     Head: Normocephalic and atraumatic.     Right Ear: Tympanic membrane, ear canal and external ear normal.     Left Ear: Tympanic membrane, ear canal and external ear normal.     Mouth/Throat:  Mouth: Mucous membranes are moist.     Pharynx: Oropharynx is clear. No oropharyngeal exudate or posterior oropharyngeal erythema.  Eyes:     General: No scleral icterus.       Right eye: No discharge.        Left eye: No discharge.     Conjunctiva/sclera: Conjunctivae normal.     Pupils: Pupils are equal, round, and reactive to light.  Neck:     Vascular: No carotid bruit.  Cardiovascular:     Rate and Rhythm: Normal rate and regular rhythm.  Pulmonary:     Effort: Pulmonary effort is normal.     Breath sounds: Normal breath sounds.  Abdominal:     General: Bowel sounds are normal.  Musculoskeletal:     Cervical back: No rigidity or tenderness.     Right lower leg: No edema.     Left lower leg: No edema.  Lymphadenopathy:      Cervical: No cervical adenopathy.  Skin:    General: Skin is warm and dry.  Neurological:     Mental Status: She is alert and oriented to person, place, and time.  Psychiatric:        Mood and Affect: Mood normal.        Behavior: Behavior normal.    Diabetic Foot Exam - Simple   Simple Foot Form Diabetic Foot exam was performed with the following findings: Yes 07/18/2020 10:42 AM  Visual Inspection See comments: Yes Sensation Testing Intact to touch and monofilament testing bilaterally: Yes Pulse Check Posterior Tibialis and Dorsalis pulse intact bilaterally: Yes Comments  Feet are cavus bilaterally.  No lesions or rashes.    Diabetic Foot Exam - Simple   Simple Foot Form Diabetic Foot exam was performed with the following findings: Yes 07/18/2020 10:42 AM  Visual Inspection See comments: Yes Sensation Testing Intact to touch and monofilament testing bilaterally: Yes Pulse Check Posterior Tibialis and Dorsalis pulse intact bilaterally: Yes Comments  Feet are cavus bilaterally.  No lesions or rashes.    Assessment & Plan:   Problem List Items Addressed This Visit      Cardiovascular and Mediastinum   Essential hypertension   Relevant Medications   lisinopril (ZESTRIL) 10 MG tablet   Other Relevant Orders   CBC   Comprehensive metabolic panel     Endocrine   Type 2 diabetes mellitus without complication, without long-term current use of insulin (HCC)   Relevant Medications   lisinopril (ZESTRIL) 10 MG tablet   Other Relevant Orders   Comprehensive metabolic panel   Hemoglobin A1c   Urinalysis, Routine w reflex microscopic   Microalbumin / creatinine urine ratio     Other   Healthcare maintenance - Primary   Elevated cholesterol   Relevant Medications   lisinopril (ZESTRIL) 10 MG tablet   Other Relevant Orders   Comprehensive metabolic panel   Lipid panel      Outpatient Encounter Medications as of 07/18/2020  Medication Sig  . aspirin 81 MG  tablet Take 81 mg by mouth daily. Reported on 08/29/2015  . glucose blood test strip freestyle lite test strip  USE TO CHECK BLOOD SUGAR 6 TIMES A DAY  . Insulin Pen Needle 31G X 5 MM MISC USE ONCE DAILY  . Lancets MISC Use to check blood sugar 6 times a day.  Dx: E11.65  . liraglutide (VICTOZA) 18 MG/3ML SOPN Victoza 3-Pak 0.6 mg/0.1 mL (18 mg/3 mL) subcutaneous pen injector  INJECT 0.3 MLS (1.8 MG TOTAL) INTO  THE SKIN DAILY.  Marland Kitchen lisinopril (ZESTRIL) 10 MG tablet Take 1 tablet (10 mg total) by mouth daily.  . metFORMIN (GLUCOPHAGE) 1000 MG tablet TAKE 1 TABLET BY MOUTH TWICE DAILY  . pravastatin (PRAVACHOL) 40 MG tablet Take 40 mg by mouth daily.  . pravastatin (PRAVACHOL) 40 MG tablet TAKE 1 TABLET BY MOUTH ONCE DAILY  . [DISCONTINUED] lisinopril (ZESTRIL) 5 MG tablet TAKE 1 TABLET BY MOUTH DAILY.  . pravastatin (PRAVACHOL) 40 MG tablet MAKE 1 TABLET BY MOUTH ONCE DAILY  . [DISCONTINUED] bisoprolol-hydrochlorothiazide (ZIAC) 5-6.25 MG tablet Take 1 tablet by mouth daily.  . [DISCONTINUED] glucose blood test strip USE TO CHECK BLOOD SUGAR 6 TIMES A DAY  . [DISCONTINUED] glucose blood test strip FreeStyle Lite Strips  . [DISCONTINUED] liraglutide (VICTOZA) 18 MG/3ML SOPN INJECT 0.3MLS INTO THE SKIN DAILY  . [DISCONTINUED] lisinopril (PRINIVIL,ZESTRIL) 2.5 MG tablet Take 2.5 mg by mouth daily.  . [DISCONTINUED] lisinopril (ZESTRIL) 5 MG tablet TAKE 1 TABLET BY MOUTH ONCE DAILY  . [DISCONTINUED] metFORMIN (GLUMETZA) 500 MG (MOD) 24 hr tablet Take 1,000 mg by mouth 2 (two) times daily with a meal.   . [DISCONTINUED] oxyCODONE-acetaminophen (PERCOCET/ROXICET) 5-325 MG tablet Take 1-2 tablets by mouth every 6 (six) hours as needed for severe pain.  . [DISCONTINUED] sitaGLIPtin (JANUVIA) 100 MG tablet Take 100 mg by mouth daily.   No facility-administered encounter medications on file as of 07/18/2020.    Follow-up: Return in about 3 months (around 10/18/2020).   She is planning follow-up with Dr.  Loreta Ave who does her colonoscopies and her GYN doctor Dr. Allena Katz for her next Pap smear.  She sees her endocrinologist Dr. Allena Katz for her diabetes care.  Have increased her Zestril to 10 mg from 5.  Mliss Sax, MD

## 2020-07-26 ENCOUNTER — Other Ambulatory Visit (HOSPITAL_BASED_OUTPATIENT_CLINIC_OR_DEPARTMENT_OTHER): Payer: Self-pay

## 2020-07-26 DIAGNOSIS — M65341 Trigger finger, right ring finger: Secondary | ICD-10-CM | POA: Diagnosis not present

## 2020-08-01 ENCOUNTER — Other Ambulatory Visit (HOSPITAL_BASED_OUTPATIENT_CLINIC_OR_DEPARTMENT_OTHER): Payer: Self-pay

## 2020-08-03 ENCOUNTER — Other Ambulatory Visit (HOSPITAL_BASED_OUTPATIENT_CLINIC_OR_DEPARTMENT_OTHER): Payer: Self-pay

## 2020-08-03 DIAGNOSIS — E1165 Type 2 diabetes mellitus with hyperglycemia: Secondary | ICD-10-CM | POA: Diagnosis not present

## 2020-08-03 DIAGNOSIS — E782 Mixed hyperlipidemia: Secondary | ICD-10-CM | POA: Diagnosis not present

## 2020-08-03 DIAGNOSIS — I10 Essential (primary) hypertension: Secondary | ICD-10-CM | POA: Diagnosis not present

## 2020-08-03 MED ORDER — INSULIN PEN NEEDLE 31G X 5 MM MISC
3 refills | Status: AC
  Filled 2020-08-03: qty 100, 90d supply, fill #0
  Filled 2020-11-28: qty 100, 90d supply, fill #1

## 2020-08-03 MED ORDER — VICTOZA 18 MG/3ML ~~LOC~~ SOPN
1.8000 mg | PEN_INJECTOR | Freq: Every day | SUBCUTANEOUS | 1 refills | Status: AC
  Filled 2020-08-03 – 2020-09-05 (×2): qty 27, 90d supply, fill #0
  Filled 2020-11-28: qty 27, 90d supply, fill #1

## 2020-08-03 MED ORDER — LISINOPRIL 10 MG PO TABS
1.0000 | ORAL_TABLET | Freq: Every day | ORAL | 1 refills | Status: AC
  Filled 2020-08-03: qty 90, 90d supply, fill #0
  Filled 2020-11-02: qty 90, 90d supply, fill #1

## 2020-08-03 MED ORDER — PRAVASTATIN SODIUM 40 MG PO TABS
40.0000 mg | ORAL_TABLET | Freq: Every day | ORAL | 3 refills | Status: AC
  Filled 2020-08-03: qty 90, 90d supply, fill #0
  Filled 2020-11-28: qty 90, 90d supply, fill #1
  Filled 2021-03-05: qty 90, 90d supply, fill #2
  Filled 2021-06-14: qty 90, 90d supply, fill #3

## 2020-08-03 MED ORDER — METFORMIN HCL 1000 MG PO TABS
ORAL_TABLET | ORAL | 3 refills | Status: AC
  Filled 2020-08-03 – 2020-10-02 (×2): qty 180, 90d supply, fill #0
  Filled 2021-01-02: qty 180, 90d supply, fill #1

## 2020-08-08 ENCOUNTER — Other Ambulatory Visit (HOSPITAL_BASED_OUTPATIENT_CLINIC_OR_DEPARTMENT_OTHER): Payer: Self-pay

## 2020-09-05 ENCOUNTER — Other Ambulatory Visit (HOSPITAL_BASED_OUTPATIENT_CLINIC_OR_DEPARTMENT_OTHER): Payer: Self-pay

## 2020-10-02 ENCOUNTER — Other Ambulatory Visit (HOSPITAL_BASED_OUTPATIENT_CLINIC_OR_DEPARTMENT_OTHER): Payer: Self-pay

## 2020-10-19 ENCOUNTER — Ambulatory Visit: Payer: 59 | Admitting: Family Medicine

## 2020-11-02 ENCOUNTER — Other Ambulatory Visit (HOSPITAL_BASED_OUTPATIENT_CLINIC_OR_DEPARTMENT_OTHER): Payer: Self-pay

## 2020-11-28 ENCOUNTER — Other Ambulatory Visit (HOSPITAL_BASED_OUTPATIENT_CLINIC_OR_DEPARTMENT_OTHER): Payer: Self-pay

## 2020-12-07 ENCOUNTER — Other Ambulatory Visit (HOSPITAL_BASED_OUTPATIENT_CLINIC_OR_DEPARTMENT_OTHER): Payer: Self-pay

## 2020-12-07 DIAGNOSIS — Z7985 Long-term (current) use of injectable non-insulin antidiabetic drugs: Secondary | ICD-10-CM | POA: Diagnosis not present

## 2020-12-07 DIAGNOSIS — Z7984 Long term (current) use of oral hypoglycemic drugs: Secondary | ICD-10-CM | POA: Diagnosis not present

## 2020-12-07 DIAGNOSIS — I10 Essential (primary) hypertension: Secondary | ICD-10-CM | POA: Diagnosis not present

## 2020-12-07 DIAGNOSIS — E782 Mixed hyperlipidemia: Secondary | ICD-10-CM | POA: Diagnosis not present

## 2020-12-07 DIAGNOSIS — E1165 Type 2 diabetes mellitus with hyperglycemia: Secondary | ICD-10-CM | POA: Diagnosis not present

## 2020-12-07 MED ORDER — MOUNJARO 5 MG/0.5ML ~~LOC~~ SOAJ
SUBCUTANEOUS | 1 refills | Status: AC
  Filled 2020-12-07: qty 2, 28d supply, fill #0
  Filled 2021-04-26: qty 6, 84d supply, fill #1
  Filled 2021-07-23: qty 2, 28d supply, fill #2
  Filled 2021-08-21: qty 2, 28d supply, fill #3

## 2020-12-07 MED ORDER — DEXCOM G6 TRANSMITTER MISC
3 refills | Status: AC
  Filled 2020-12-07: qty 1, 90d supply, fill #0

## 2020-12-07 MED ORDER — DEXCOM G6 RECEIVER DEVI
0 refills | Status: AC
  Filled 2020-12-07: qty 1, 90d supply, fill #0

## 2020-12-07 MED ORDER — METFORMIN HCL 1000 MG PO TABS
ORAL_TABLET | ORAL | 3 refills | Status: DC
  Filled 2020-12-07: qty 180, 90d supply, fill #0

## 2020-12-07 MED ORDER — DEXCOM G6 SENSOR MISC
3 refills | Status: AC
  Filled 2020-12-07: qty 3, 30d supply, fill #0

## 2020-12-08 ENCOUNTER — Other Ambulatory Visit (HOSPITAL_BASED_OUTPATIENT_CLINIC_OR_DEPARTMENT_OTHER): Payer: Self-pay

## 2020-12-11 ENCOUNTER — Other Ambulatory Visit (HOSPITAL_BASED_OUTPATIENT_CLINIC_OR_DEPARTMENT_OTHER): Payer: Self-pay

## 2020-12-13 ENCOUNTER — Other Ambulatory Visit (HOSPITAL_BASED_OUTPATIENT_CLINIC_OR_DEPARTMENT_OTHER): Payer: Self-pay

## 2020-12-15 ENCOUNTER — Other Ambulatory Visit (HOSPITAL_BASED_OUTPATIENT_CLINIC_OR_DEPARTMENT_OTHER): Payer: Self-pay

## 2020-12-18 ENCOUNTER — Other Ambulatory Visit (HOSPITAL_BASED_OUTPATIENT_CLINIC_OR_DEPARTMENT_OTHER): Payer: Self-pay

## 2021-01-02 ENCOUNTER — Other Ambulatory Visit (HOSPITAL_BASED_OUTPATIENT_CLINIC_OR_DEPARTMENT_OTHER): Payer: Self-pay

## 2021-01-29 ENCOUNTER — Other Ambulatory Visit (HOSPITAL_BASED_OUTPATIENT_CLINIC_OR_DEPARTMENT_OTHER): Payer: Self-pay

## 2021-02-28 ENCOUNTER — Other Ambulatory Visit (HOSPITAL_BASED_OUTPATIENT_CLINIC_OR_DEPARTMENT_OTHER): Payer: Self-pay

## 2021-03-05 ENCOUNTER — Other Ambulatory Visit (HOSPITAL_BASED_OUTPATIENT_CLINIC_OR_DEPARTMENT_OTHER): Payer: Self-pay

## 2021-03-22 ENCOUNTER — Other Ambulatory Visit (HOSPITAL_BASED_OUTPATIENT_CLINIC_OR_DEPARTMENT_OTHER): Payer: Self-pay

## 2021-03-22 DIAGNOSIS — I10 Essential (primary) hypertension: Secondary | ICD-10-CM | POA: Diagnosis not present

## 2021-03-22 DIAGNOSIS — Z7984 Long term (current) use of oral hypoglycemic drugs: Secondary | ICD-10-CM | POA: Diagnosis not present

## 2021-03-22 DIAGNOSIS — E1165 Type 2 diabetes mellitus with hyperglycemia: Secondary | ICD-10-CM | POA: Diagnosis not present

## 2021-03-22 DIAGNOSIS — Z7985 Long-term (current) use of injectable non-insulin antidiabetic drugs: Secondary | ICD-10-CM | POA: Diagnosis not present

## 2021-03-22 DIAGNOSIS — E782 Mixed hyperlipidemia: Secondary | ICD-10-CM | POA: Diagnosis not present

## 2021-03-22 MED ORDER — DEXCOM G6 TRANSMITTER MISC
3 refills | Status: AC
  Filled 2021-03-22: qty 1, 90d supply, fill #0

## 2021-03-22 MED ORDER — PRAVASTATIN SODIUM 40 MG PO TABS
40.0000 mg | ORAL_TABLET | Freq: Every day | ORAL | 3 refills | Status: AC
  Filled 2021-03-22 – 2021-09-10 (×2): qty 90, 90d supply, fill #0

## 2021-03-22 MED ORDER — METFORMIN HCL 1000 MG PO TABS
ORAL_TABLET | ORAL | 3 refills | Status: DC
  Filled 2021-03-22: qty 180, 90d supply, fill #0
  Filled 2021-07-09: qty 180, 90d supply, fill #1

## 2021-03-22 MED ORDER — DEXCOM G6 SENSOR MISC
3 refills | Status: AC
  Filled 2021-03-22: qty 9, 90d supply, fill #0
  Filled 2021-05-29 – 2021-06-14 (×3): qty 9, 90d supply, fill #1
  Filled 2021-09-10: qty 3, 30d supply, fill #2
  Filled 2022-01-14: qty 3, 30d supply, fill #3

## 2021-03-22 MED ORDER — LISINOPRIL 10 MG PO TABS
10.0000 mg | ORAL_TABLET | Freq: Every day | ORAL | 1 refills | Status: AC
  Filled 2021-03-22 – 2021-07-23 (×2): qty 90, 90d supply, fill #0
  Filled 2021-10-12: qty 90, 90d supply, fill #1

## 2021-03-29 ENCOUNTER — Other Ambulatory Visit (HOSPITAL_BASED_OUTPATIENT_CLINIC_OR_DEPARTMENT_OTHER): Payer: Self-pay

## 2021-04-06 ENCOUNTER — Other Ambulatory Visit (HOSPITAL_BASED_OUTPATIENT_CLINIC_OR_DEPARTMENT_OTHER): Payer: Self-pay

## 2021-04-06 MED ORDER — FREESTYLE LITE TEST VI STRP
ORAL_STRIP | 5 refills | Status: AC
  Filled 2021-04-06: qty 200, 50d supply, fill #0

## 2021-04-23 ENCOUNTER — Other Ambulatory Visit (HOSPITAL_BASED_OUTPATIENT_CLINIC_OR_DEPARTMENT_OTHER): Payer: Self-pay

## 2021-04-26 ENCOUNTER — Other Ambulatory Visit (HOSPITAL_BASED_OUTPATIENT_CLINIC_OR_DEPARTMENT_OTHER): Payer: Self-pay

## 2021-05-29 ENCOUNTER — Other Ambulatory Visit (HOSPITAL_BASED_OUTPATIENT_CLINIC_OR_DEPARTMENT_OTHER): Payer: Self-pay

## 2021-05-30 DIAGNOSIS — Z135 Encounter for screening for eye and ear disorders: Secondary | ICD-10-CM | POA: Diagnosis not present

## 2021-05-30 DIAGNOSIS — H524 Presbyopia: Secondary | ICD-10-CM | POA: Diagnosis not present

## 2021-06-04 ENCOUNTER — Other Ambulatory Visit (HOSPITAL_BASED_OUTPATIENT_CLINIC_OR_DEPARTMENT_OTHER): Payer: Self-pay

## 2021-06-07 DIAGNOSIS — R928 Other abnormal and inconclusive findings on diagnostic imaging of breast: Secondary | ICD-10-CM | POA: Diagnosis not present

## 2021-06-07 DIAGNOSIS — Z1231 Encounter for screening mammogram for malignant neoplasm of breast: Secondary | ICD-10-CM | POA: Diagnosis not present

## 2021-06-12 ENCOUNTER — Other Ambulatory Visit (HOSPITAL_BASED_OUTPATIENT_CLINIC_OR_DEPARTMENT_OTHER): Payer: Self-pay

## 2021-06-14 ENCOUNTER — Other Ambulatory Visit (HOSPITAL_BASED_OUTPATIENT_CLINIC_OR_DEPARTMENT_OTHER): Payer: Self-pay

## 2021-07-09 ENCOUNTER — Other Ambulatory Visit (HOSPITAL_BASED_OUTPATIENT_CLINIC_OR_DEPARTMENT_OTHER): Payer: Self-pay

## 2021-07-23 ENCOUNTER — Other Ambulatory Visit (HOSPITAL_BASED_OUTPATIENT_CLINIC_OR_DEPARTMENT_OTHER): Payer: Self-pay

## 2021-07-26 ENCOUNTER — Other Ambulatory Visit (HOSPITAL_BASED_OUTPATIENT_CLINIC_OR_DEPARTMENT_OTHER): Payer: Self-pay

## 2021-07-26 DIAGNOSIS — Z7985 Long-term (current) use of injectable non-insulin antidiabetic drugs: Secondary | ICD-10-CM | POA: Diagnosis not present

## 2021-07-26 DIAGNOSIS — I10 Essential (primary) hypertension: Secondary | ICD-10-CM | POA: Diagnosis not present

## 2021-07-26 DIAGNOSIS — Z7984 Long term (current) use of oral hypoglycemic drugs: Secondary | ICD-10-CM | POA: Diagnosis not present

## 2021-07-26 DIAGNOSIS — E782 Mixed hyperlipidemia: Secondary | ICD-10-CM | POA: Diagnosis not present

## 2021-07-26 DIAGNOSIS — E1165 Type 2 diabetes mellitus with hyperglycemia: Secondary | ICD-10-CM | POA: Diagnosis not present

## 2021-07-26 MED ORDER — METFORMIN HCL 1000 MG PO TABS
ORAL_TABLET | ORAL | 3 refills | Status: AC
  Filled 2021-10-12: qty 90, 90d supply, fill #0

## 2021-07-26 MED ORDER — LISINOPRIL 10 MG PO TABS: 10.0000 mg | ORAL_TABLET | Freq: Every day | ORAL | 1 refills | Status: AC

## 2021-07-26 MED ORDER — PRAVASTATIN SODIUM 40 MG PO TABS: 40.0000 mg | ORAL_TABLET | Freq: Every day | ORAL | 3 refills | Status: AC

## 2021-07-26 MED ORDER — MOUNJARO 5 MG/0.5ML ~~LOC~~ SOAJ
SUBCUTANEOUS | 1 refills | Status: AC
  Filled 2021-09-17: qty 6, 84d supply, fill #0
  Filled 2021-10-16: qty 6, 84d supply, fill #1
  Filled 2022-01-11: qty 2, 28d supply, fill #1
  Filled 2022-03-14: qty 2, 28d supply, fill #2
  Filled 2022-07-10: qty 2, 28d supply, fill #3

## 2021-08-21 ENCOUNTER — Other Ambulatory Visit (HOSPITAL_BASED_OUTPATIENT_CLINIC_OR_DEPARTMENT_OTHER): Payer: Self-pay

## 2021-09-10 ENCOUNTER — Other Ambulatory Visit (HOSPITAL_BASED_OUTPATIENT_CLINIC_OR_DEPARTMENT_OTHER): Payer: Self-pay

## 2021-09-17 ENCOUNTER — Other Ambulatory Visit (HOSPITAL_BASED_OUTPATIENT_CLINIC_OR_DEPARTMENT_OTHER): Payer: Self-pay

## 2021-09-18 ENCOUNTER — Other Ambulatory Visit (HOSPITAL_BASED_OUTPATIENT_CLINIC_OR_DEPARTMENT_OTHER): Payer: Self-pay

## 2021-09-19 ENCOUNTER — Other Ambulatory Visit (HOSPITAL_BASED_OUTPATIENT_CLINIC_OR_DEPARTMENT_OTHER): Payer: Self-pay

## 2021-09-19 MED ORDER — DEXCOM G7 SENSOR MISC
11 refills | Status: AC
  Filled 2021-09-19: qty 9, 90d supply, fill #0
  Filled 2021-10-04: qty 3, 30d supply, fill #0
  Filled 2021-11-09: qty 3, 30d supply, fill #1
  Filled 2022-01-11: qty 3, 30d supply, fill #2

## 2021-09-27 ENCOUNTER — Other Ambulatory Visit (HOSPITAL_BASED_OUTPATIENT_CLINIC_OR_DEPARTMENT_OTHER): Payer: Self-pay

## 2021-10-01 ENCOUNTER — Ambulatory Visit (HOSPITAL_COMMUNITY)
Admission: EM | Admit: 2021-10-01 | Discharge: 2021-10-01 | Disposition: A | Discharge status: Home / Self Care | Payer: 59 | Attending: Family Medicine | Admitting: Family Medicine

## 2021-10-01 ENCOUNTER — Encounter (HOSPITAL_COMMUNITY): Payer: Self-pay

## 2021-10-01 DIAGNOSIS — J029 Acute pharyngitis, unspecified: Secondary | ICD-10-CM | POA: Insufficient documentation

## 2021-10-01 DIAGNOSIS — Z20822 Contact with and (suspected) exposure to covid-19: Secondary | ICD-10-CM | POA: Insufficient documentation

## 2021-10-01 DIAGNOSIS — J069 Acute upper respiratory infection, unspecified: Secondary | ICD-10-CM | POA: Diagnosis not present

## 2021-10-01 LAB — POCT RAPID STREP A, ED / UC: Streptococcus, Group A Screen (Direct): NEGATIVE

## 2021-10-01 MED ORDER — BENZONATATE 100 MG PO CAPS: 100.0000 mg | ORAL_CAPSULE | Freq: Three times a day (TID) | ORAL | 0 refills | Status: AC | PRN

## 2021-10-01 NOTE — Discharge Instructions (Addendum)
Your strep test is negative.  Culture of the throat will be sent, and staff will notify you if that is in turn positive.   You have been swabbed for COVID, and the test will result in the next 24 hours. Our staff will call you if positive. If the test is positive, you should quarantine for 5 days.  Take benzonatate 100 mg, 1 tab every 8 hours as needed for cough.

## 2021-10-01 NOTE — ED Provider Notes (Signed)
[facility]    CSN: [id number] Arrival date & time: 10/01/21  1741      History   Chief Complaint Chief Complaint  Patient presents with   Sore Throat   Cough   Nasal Congestion    HPI Kimberly Wilkins is a 61 y.o. female.    Sore Throat  Cough  Here for a 3-day history of sore throat, cough, and nasal congestion.  She has not been able to tell she has any fever.  No ear pain, and no vomiting or diarrhea.  No shortness of breath   She does have a history of diabetes   Past Medical History:  Diagnosis Date   Diabetes mellitus without complication (HCC)    Obesity     Patient Active Problem List   Diagnosis Date Noted   Uncontrolled type 2 diabetes mellitus with hyperglycemia, with long-term current use of insulin (HCC) 07/18/2020   Essential hypertension 07/18/2020   Healthcare maintenance 07/18/2020   Type 2 diabetes mellitus without complication, without long-term current use of insulin (HCC) 07/18/2020   Elevated cholesterol 07/18/2020    Past Surgical History:  Procedure Laterality Date   ABDOMINAL HYSTERECTOMY      OB History   No obstetric history on file.      Home Medications    Prior to Admission medications   Medication Sig Start Date End Date Taking? Authorizing Provider  benzonatate (TESSALON) 100 MG capsule Take 1 capsule (100 mg total) by mouth 3 (three) times daily as needed for cough. 10/01/21  Yes Zenia Resides, MD  aspirin 81 MG tablet Take 81 mg by mouth daily. Reported on 08/29/2015    [provider]  Continuous Blood Gluc Receiver (DEXCOM G6 RECEIVER) DEVI Use as directed for continuous glucose monitoring. 12/07/20     Continuous Blood Gluc Sensor (DEXCOM G6 SENSOR) MISC Use 1 sensor every 10 days as directed 12/07/20     Continuous Blood Gluc Sensor (DEXCOM G6 SENSOR) MISC Use 1 sensor every 10 days 03/22/21     Continuous Blood Gluc Sensor (DEXCOM G7 SENSOR) MISC Inject 1 sensor to the skin every 10 days  for continuous glucose monitoring. 09/19/21     Continuous Blood Gluc Transmit (DEXCOM G6 TRANSMITTER) MISC Use 1 transmitter as directed 12/07/20     Continuous Blood Gluc Transmit (DEXCOM G6 TRANSMITTER) MISC Use as directed 03/22/21     glucose blood (FREESTYLE LITE) test strip Use to check blood sugar 4 times daily 04/06/21     glucose blood test strip freestyle lite test strip  USE TO CHECK BLOOD SUGAR 6 TIMES A DAY    [provider]  Insulin Pen Needle 31G X 5 MM MISC Use 1 needle once daily 08/03/20   Izell Sherrill, MD  Lancets MISC Use to check blood sugar 6 times a day.  Dx: E11.65 05/14/16   [provider]  liraglutide (VICTOZA) 18 MG/3ML SOPN Victoza 3-Pak 0.6 mg/0.1 mL (18 mg/3 mL) subcutaneous pen injector  INJECT 0.3 MLS (1.8 MG TOTAL) INTO THE SKIN DAILY. 07/22/19   [provider]  liraglutide (VICTOZA) 18 MG/3ML SOPN Inject 0.3 mLs (1.8 mg total) into the skin daily. 08/03/20     lisinopril (ZESTRIL) 10 MG tablet Take 1 tablet (10 mg total) by mouth daily. 07/18/20   Mliss Sax, MD  lisinopril (ZESTRIL) 10 MG tablet Take 1 tablet (10 mg total) by mouth daily. 08/03/20     lisinopril (ZESTRIL) 10 MG tablet Take 1  tablet (10 mg total) by mouth daily. 03/22/21     lisinopril (ZESTRIL) 10 MG tablet Take 1 tablet (10 mg total) by mouth daily. 07/26/21     metFORMIN (GLUCOPHAGE) 1000 MG tablet Take 1 tablet (1,000 mg total) by mouth 2 times daily. 08/03/20     metFORMIN (GLUCOPHAGE) 1000 MG tablet Take 1 tablet (1,000 mg total) by mouth 2 times daily. 12/07/20     metFORMIN (GLUCOPHAGE) 1000 MG tablet Take 1 tablet (1,000 mg total) by mouth 2 times daily. 03/22/21     metFORMIN (GLUCOPHAGE) 1000 MG tablet Take 1 tablet (1,000 mg total) by mouth daily with breakfast. 07/26/21     pravastatin (PRAVACHOL) 40 MG tablet Take 40 mg by mouth daily.    [provider]  pravastatin (PRAVACHOL) 40 MG tablet TAKE 1 TABLET BY MOUTH ONCE DAILY 01/25/20 01/24/21  Izell Newfolden, MD  pravastatin (PRAVACHOL) 40 MG tablet MAKE 1 TABLET BY MOUTH ONCE DAILY 04/01/19 03/31/20  Izell Bartlett, MD  pravastatin (PRAVACHOL) 40 MG tablet TAKE 1 TABLET BY MOUTH DAILY. 08/03/20     pravastatin (PRAVACHOL) 40 MG tablet TAKE 1 TABLET BY MOUTH DAILY. 03/22/21     pravastatin (PRAVACHOL) 40 MG tablet TAKE 1 TABLET BY MOUTH DAILY. 07/26/21     tirzepatide (MOUNJARO) 5 MG/0.5ML Pen Inject 5 mg into the skin once a week. 12/07/20     tirzepatide (MOUNJARO) 5 MG/0.5ML Pen Inject 5 mg into the skin once a week. 07/26/21       Family History Family History  Problem Relation Age of Onset   Diabetes Sister     Social History Social History   Tobacco Use   Smoking status: Never   Smokeless tobacco: Never  Vaping Use   Vaping Use: Never used  Substance Use Topics   Alcohol use: No    Alcohol/week: 0.0 standard drinks of alcohol   Drug use: Not Currently     Allergies   Sulfa antibiotics   Review of Systems Review of Systems  Respiratory:  Positive for cough.      Physical Exam Triage Vital Signs ED Triage Vitals [10/01/21 1849]  Enc Vitals Group     BP (!) 181/109     Pulse Rate 92     Resp 16     Temp 98.7 F (37.1 C)     Temp Source Oral     SpO2 93 %     Weight      Height      Head Circumference      Peak Flow      Pain Score      Pain Loc      Pain Edu?      Excl. in GC?    No data found.  Updated Vital Signs BP (!) 181/109 (BP Location: Left Arm)   Pulse 92   Temp 98.7 F (37.1 C) (Oral)   Resp 16   SpO2 93%   Visual Acuity Right Eye Distance:   Left Eye Distance:   Bilateral Distance:    Right Eye Near:   Left Eye Near:    Bilateral Near:     Physical Exam Vitals reviewed.  Constitutional:      General: She is not in acute distress.    Appearance: She is not toxic-appearing.  HENT:     Right Ear: Tympanic membrane and ear canal normal.     Left Ear: Tympanic membrane and ear canal normal.     Nose: Nose normal.  Mouth/Throat:     Mouth: Mucous membranes are moist.     Comments: There is clear mucus draining in the oropharynx Eyes:     Extraocular Movements: Extraocular movements intact.     Conjunctiva/sclera: Conjunctivae normal.     Pupils: Pupils are equal, round, and reactive to light.  Cardiovascular:     Rate and Rhythm: Normal rate and regular rhythm.     Heart sounds: No murmur heard. Pulmonary:     Effort: Pulmonary effort is normal. No respiratory distress.     Breath sounds: No stridor. No wheezing, rhonchi or rales.  Musculoskeletal:     Cervical back: Neck supple.  Lymphadenopathy:     Cervical: No cervical adenopathy.  Skin:    Capillary Refill: Capillary refill takes less than 2 seconds.     Coloration: Skin is not jaundiced or pale.  Neurological:     General: No focal deficit present.     Mental Status: She is alert and oriented to person, place, and time.  Psychiatric:        Behavior: Behavior normal.      UC Treatments / Results  Labs (all labs ordered are listed, but only abnormal results are displayed) Labs Reviewed  CULTURE, GROUP A STREP ([facility])  SARS CORONAVIRUS 2 (TAT 6-24 HRS)  POCT RAPID STREP A, ED / UC    EKG   Radiology No results found.  Procedures Procedures (including critical care time)  Medications Ordered in UC Medications - No data to display  Initial Impression / Assessment and Plan / UC Course  I have reviewed the triage vital signs and the nursing notes.  Pertinent labs & imaging results that were available during my care of the patient were reviewed by me and considered in my medical decision making (see chart for details).     Rapid strep is negative, and we will do culture.  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 Final Clinical Impressions(s) / UC Diagnoses   Final diagnoses:  Viral upper respiratory tract infection  Sore throat     Discharge Instructions      Your strep test is negative.  Culture of the throat will be sent, and staff will notify you if that is in turn positive.   You have been swabbed for COVID, and the test will result in the next 24 hours. Our staff will call you if positive. If the test is positive, you should quarantine for 5 days.  Take benzonatate 100 mg, 1 tab every 8 hours as needed for cough.       ED Prescriptions     Medication Sig Dispense Auth. Provider   benzonatate (TESSALON) 100 MG capsule Take 1 capsule (100 mg total) by mouth 3 (three) times daily as needed for cough. 21 capsule Zenia Resides, MD      PDMP not reviewed this encounter.   Zenia Resides, MD 10/01/21 1946

## 2021-10-02 LAB — SARS CORONAVIRUS 2 (TAT 6-24 HRS): SARS Coronavirus 2: NEGATIVE

## 2021-10-04 ENCOUNTER — Other Ambulatory Visit (HOSPITAL_BASED_OUTPATIENT_CLINIC_OR_DEPARTMENT_OTHER): Payer: Self-pay

## 2021-10-04 LAB — CULTURE, GROUP A STREP (THRC)

## 2021-10-12 ENCOUNTER — Other Ambulatory Visit (HOSPITAL_BASED_OUTPATIENT_CLINIC_OR_DEPARTMENT_OTHER): Payer: Self-pay

## 2021-10-16 ENCOUNTER — Other Ambulatory Visit (HOSPITAL_BASED_OUTPATIENT_CLINIC_OR_DEPARTMENT_OTHER): Payer: Self-pay

## 2021-11-09 ENCOUNTER — Other Ambulatory Visit (HOSPITAL_BASED_OUTPATIENT_CLINIC_OR_DEPARTMENT_OTHER): Payer: Self-pay

## 2021-11-28 ENCOUNTER — Other Ambulatory Visit (HOSPITAL_BASED_OUTPATIENT_CLINIC_OR_DEPARTMENT_OTHER): Payer: Self-pay

## 2021-11-28 DIAGNOSIS — Z7984 Long term (current) use of oral hypoglycemic drugs: Secondary | ICD-10-CM | POA: Diagnosis not present

## 2021-11-28 DIAGNOSIS — E119 Type 2 diabetes mellitus without complications: Secondary | ICD-10-CM | POA: Diagnosis not present

## 2021-11-28 DIAGNOSIS — Z7985 Long-term (current) use of injectable non-insulin antidiabetic drugs: Secondary | ICD-10-CM | POA: Diagnosis not present

## 2021-11-28 DIAGNOSIS — E782 Mixed hyperlipidemia: Secondary | ICD-10-CM | POA: Diagnosis not present

## 2021-11-28 DIAGNOSIS — I10 Essential (primary) hypertension: Secondary | ICD-10-CM | POA: Diagnosis not present

## 2021-11-28 MED ORDER — MOUNJARO 5 MG/0.5ML ~~LOC~~ SOAJ
SUBCUTANEOUS | 1 refills | Status: DC
  Filled 2021-11-28: qty 2, 28d supply, fill #0
  Filled 2022-02-15: qty 2, 28d supply, fill #1
  Filled 2022-04-15: qty 2, 28d supply, fill #2
  Filled 2022-04-29 – 2022-05-08 (×3): qty 2, 28d supply, fill #3
  Filled 2022-06-11: qty 2, 28d supply, fill #4
  Filled 2022-08-02: qty 2, 28d supply, fill #5

## 2021-11-28 MED ORDER — METFORMIN HCL 1000 MG PO TABS
1000.0000 mg | ORAL_TABLET | Freq: Every day | ORAL | 3 refills | Status: AC
  Filled 2021-11-28 – 2021-12-07 (×3): qty 90, 90d supply, fill #0

## 2021-11-28 MED ORDER — PRAVASTATIN SODIUM 40 MG PO TABS
40.0000 mg | ORAL_TABLET | Freq: Every day | ORAL | 3 refills | Status: AC
  Filled 2021-11-28: qty 90, 90d supply, fill #0
  Filled 2022-02-15: qty 90, 90d supply, fill #1

## 2021-11-28 MED ORDER — DEXCOM G7 SENSOR MISC
11 refills | Status: AC
  Filled 2021-11-28: qty 3, 30d supply, fill #0
  Filled 2022-01-23: qty 3, 30d supply, fill #1

## 2021-11-28 MED ORDER — LISINOPRIL 10 MG PO TABS
10.0000 mg | ORAL_TABLET | Freq: Every day | ORAL | 1 refills | Status: AC
  Filled 2021-11-28 – 2022-01-11 (×2): qty 90, 90d supply, fill #0
  Filled 2022-04-15: qty 90, 90d supply, fill #1

## 2021-12-07 ENCOUNTER — Other Ambulatory Visit (HOSPITAL_BASED_OUTPATIENT_CLINIC_OR_DEPARTMENT_OTHER): Payer: Self-pay

## 2021-12-10 ENCOUNTER — Other Ambulatory Visit (HOSPITAL_BASED_OUTPATIENT_CLINIC_OR_DEPARTMENT_OTHER): Payer: Self-pay

## 2021-12-10 MED ORDER — METFORMIN HCL 1000 MG PO TABS: 1000.0000 mg | ORAL_TABLET | Freq: Every day | ORAL | 3 refills | Status: AC

## 2021-12-14 ENCOUNTER — Other Ambulatory Visit (HOSPITAL_BASED_OUTPATIENT_CLINIC_OR_DEPARTMENT_OTHER): Payer: Self-pay

## 2021-12-18 ENCOUNTER — Other Ambulatory Visit: Payer: Self-pay

## 2022-01-11 ENCOUNTER — Other Ambulatory Visit (HOSPITAL_BASED_OUTPATIENT_CLINIC_OR_DEPARTMENT_OTHER): Payer: Self-pay

## 2022-01-14 ENCOUNTER — Other Ambulatory Visit (HOSPITAL_BASED_OUTPATIENT_CLINIC_OR_DEPARTMENT_OTHER): Payer: Self-pay

## 2022-01-14 MED ORDER — DEXCOM G7 SENSOR MISC
11 refills | Status: AC
  Filled 2022-01-14 – 2022-01-21 (×2): qty 3, 30d supply, fill #0

## 2022-01-15 ENCOUNTER — Other Ambulatory Visit (HOSPITAL_BASED_OUTPATIENT_CLINIC_OR_DEPARTMENT_OTHER): Payer: Self-pay

## 2022-01-16 ENCOUNTER — Other Ambulatory Visit (HOSPITAL_BASED_OUTPATIENT_CLINIC_OR_DEPARTMENT_OTHER): Payer: Self-pay

## 2022-01-17 ENCOUNTER — Other Ambulatory Visit (HOSPITAL_BASED_OUTPATIENT_CLINIC_OR_DEPARTMENT_OTHER): Payer: Self-pay

## 2022-01-21 ENCOUNTER — Other Ambulatory Visit (HOSPITAL_BASED_OUTPATIENT_CLINIC_OR_DEPARTMENT_OTHER): Payer: Self-pay

## 2022-01-23 ENCOUNTER — Other Ambulatory Visit (HOSPITAL_BASED_OUTPATIENT_CLINIC_OR_DEPARTMENT_OTHER): Payer: Self-pay

## 2022-01-23 MED ORDER — METFORMIN HCL 1000 MG PO TABS
1000.0000 mg | ORAL_TABLET | Freq: Two times a day (BID) | ORAL | 3 refills | Status: AC
Start: 2022-01-23 — End: ?
  Filled 2022-01-23: qty 180, 90d supply, fill #0
  Filled 2022-04-29: qty 180, 90d supply, fill #1
  Filled 2022-08-02: qty 180, 90d supply, fill #2

## 2022-01-25 ENCOUNTER — Other Ambulatory Visit (HOSPITAL_BASED_OUTPATIENT_CLINIC_OR_DEPARTMENT_OTHER): Payer: Self-pay

## 2022-01-25 MED ORDER — METFORMIN HCL 1000 MG PO TABS
1000.0000 mg | ORAL_TABLET | Freq: Two times a day (BID) | ORAL | 3 refills | Status: AC
Start: 2022-01-25 — End: ?
  Filled 2022-01-25: qty 180, 90d supply, fill #0

## 2022-01-30 ENCOUNTER — Other Ambulatory Visit (HOSPITAL_BASED_OUTPATIENT_CLINIC_OR_DEPARTMENT_OTHER): Payer: Self-pay

## 2022-02-15 ENCOUNTER — Other Ambulatory Visit (HOSPITAL_BASED_OUTPATIENT_CLINIC_OR_DEPARTMENT_OTHER): Payer: Self-pay

## 2022-03-03 IMAGING — CT CT WRIST*L* W/O CM
3 of 4 series · 9 of 36 positions shown, 10 images · non-contrast
Comparison: Wrist radiographs 6546 hours today.

CLINICAL DATA: 59-year-old female with triquetral fracture
suspected on radiographs status post fall.

EXAM:
CT OF THE LEFT WRIST WITHOUT CONTRAST
TECHNIQUE: Multidetector CT imaging was performed according to the standard
protocol. Multiplanar CT image reconstructions were also generated.

[Series 7: axial (id) · axial · 0.16mm/px · z∈[-935,-935]mm · 1 of 107 slices shown, 2 images]
[im 54/107  soft-tissue]
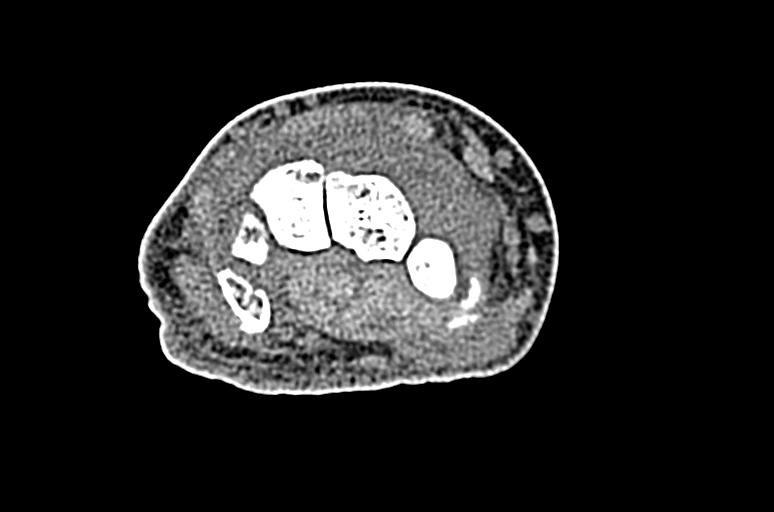
[im 54/107  bone]
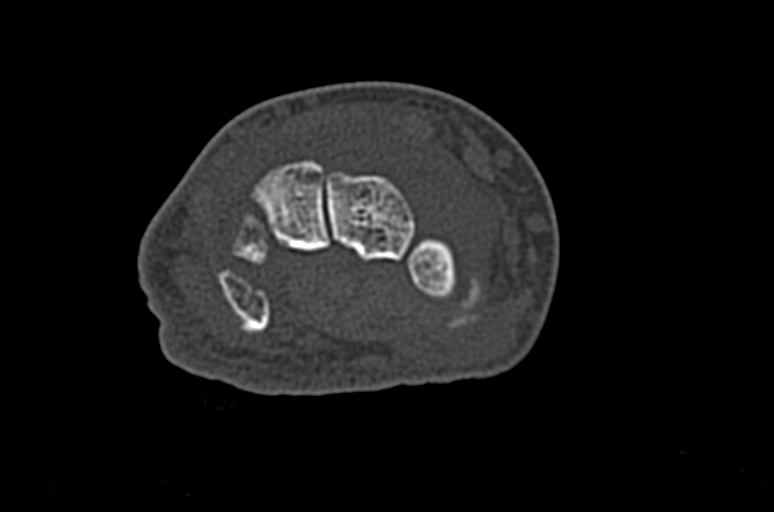

[Series 10: cor st · coronal · 0.20mm/px · 3 of 53 slices shown]
[im 11/53  bone]
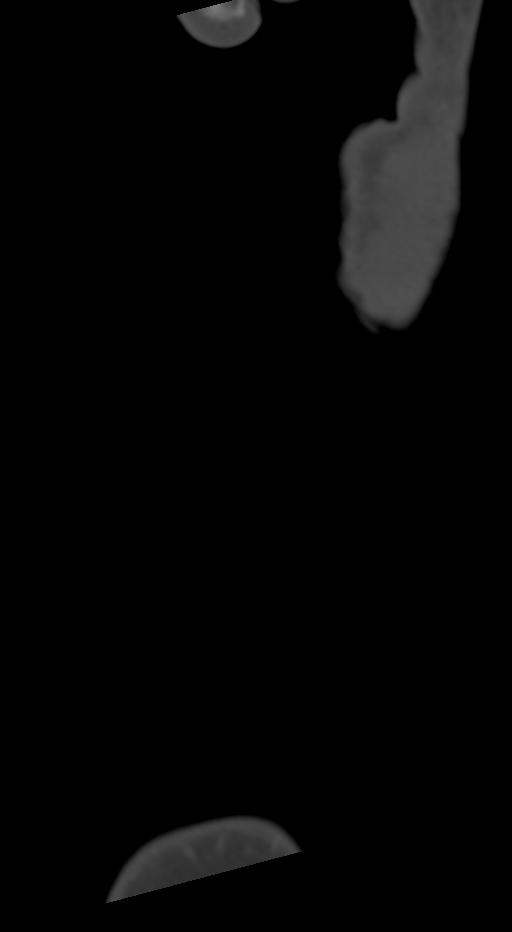
[im 21/53  bone]
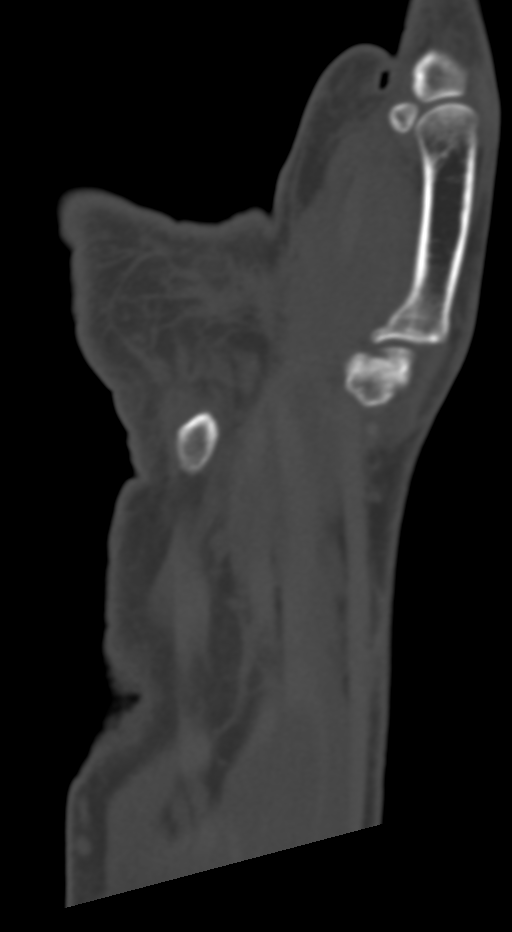
[im 32/53  bone]
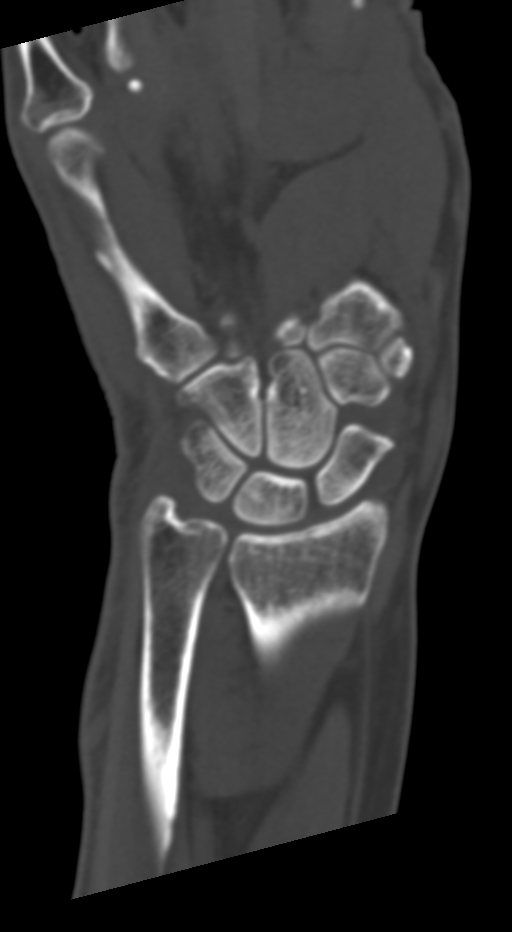

[Series 12: sagittal st · sagittal · 0.16mm/px · 5 of 63 slices shown]
[im 21/63  bone]
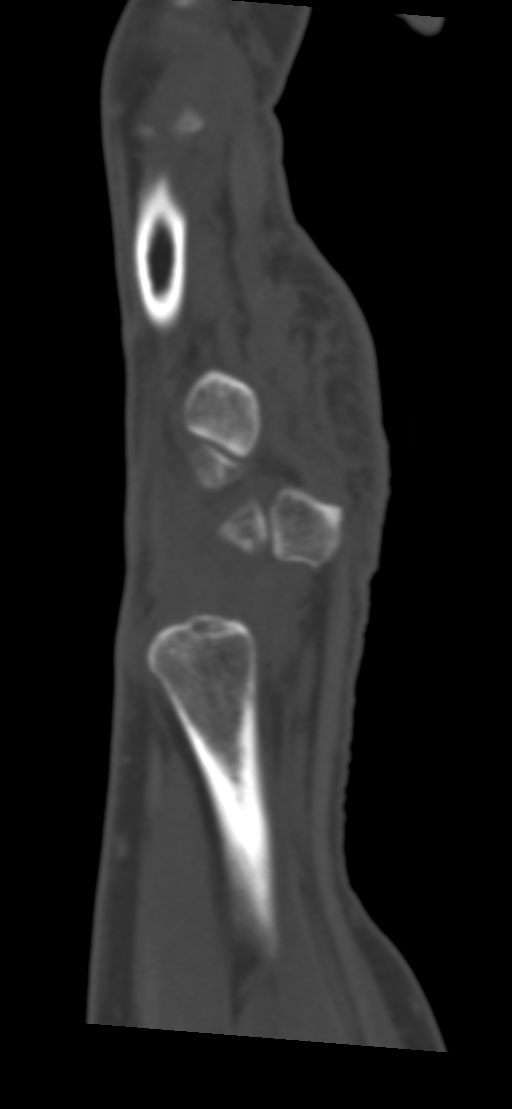
[im 26/63  bone]
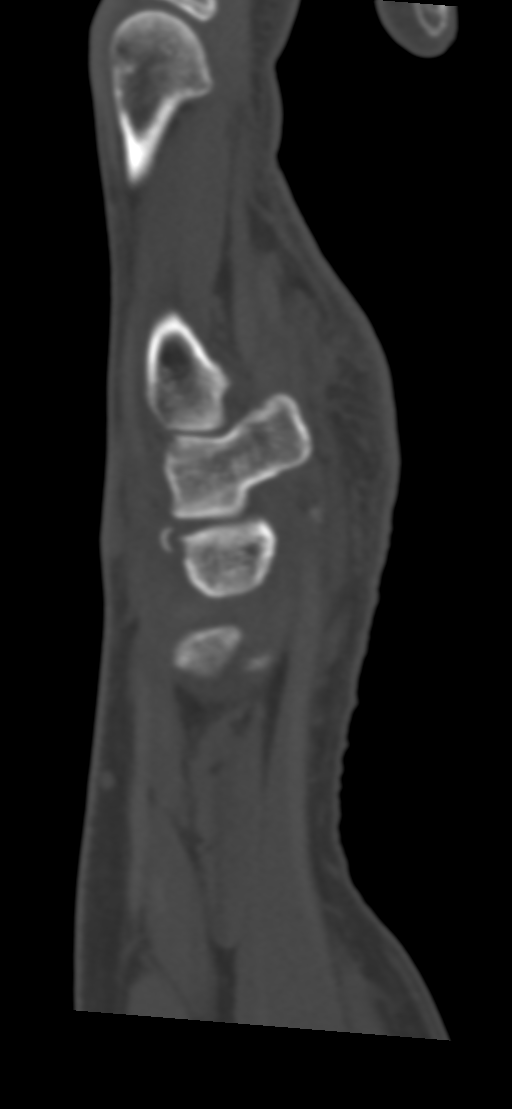
[im 32/63  bone]
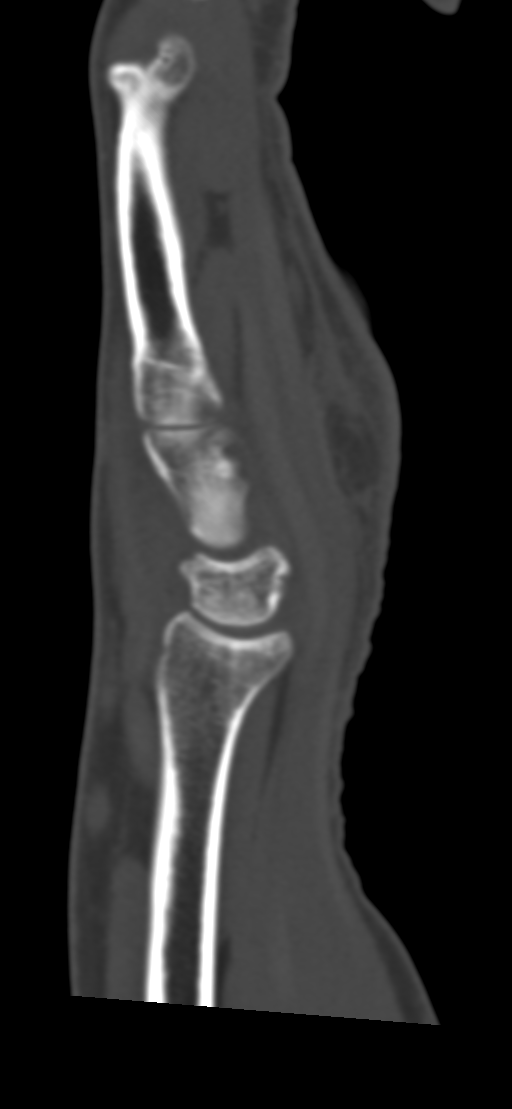
[im 37/63  bone]
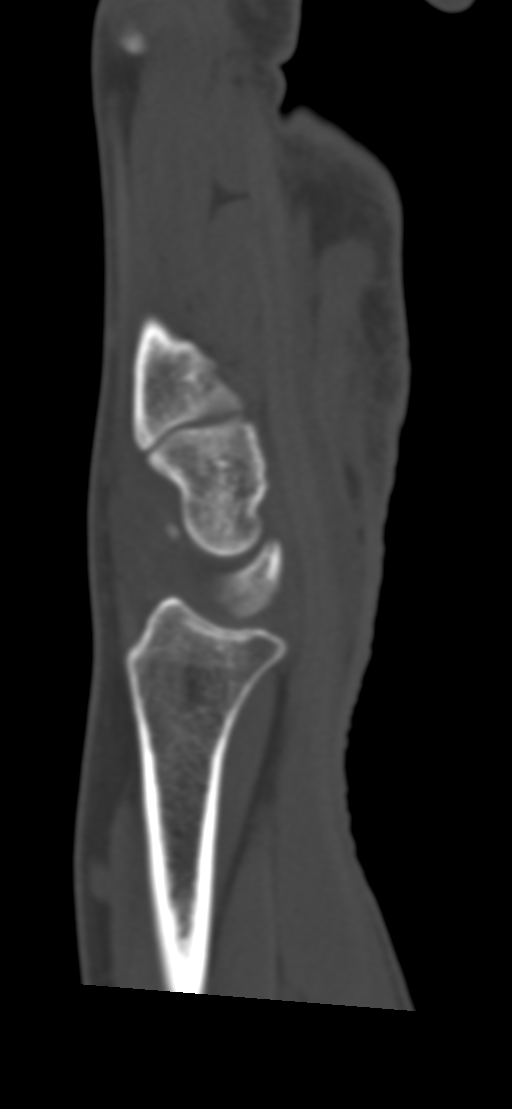
[im 42/63  bone]
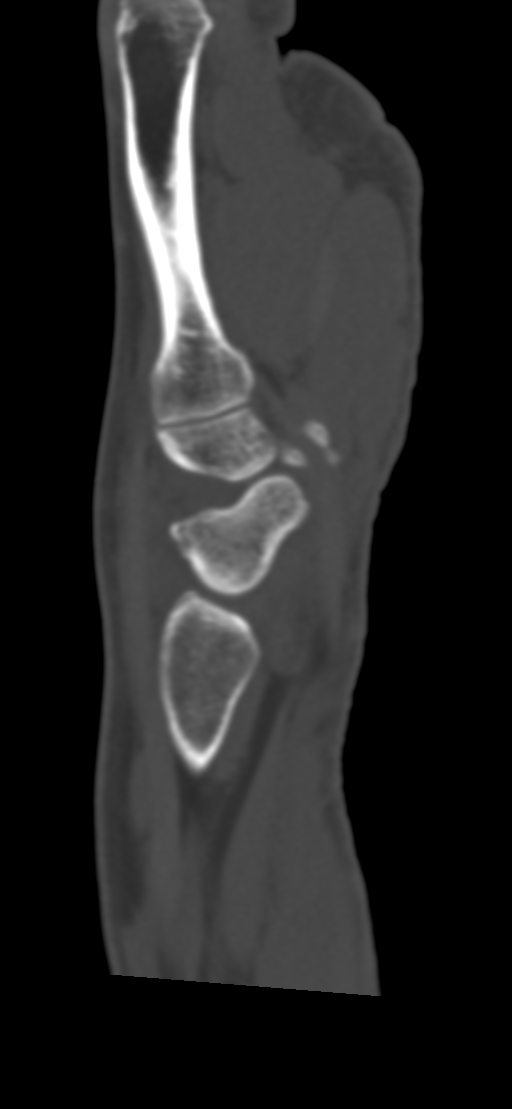

[9 of 36 positions shown; findings below may reference images not displayed]

FINDINGS: Distal radius and ulna appear intact.

Comminuted fracture of the dorsal triquetrum (series 7 images 49 and
50) with mild posterior displacement of comminution fragments. The
remaining bones of the proximal carpal row appear intact and
normally aligned.

Carpal bone joint spaces remain within normal limits. The distal
carpal row appear intact and normally aligned. Metacarpals and
visible MCP joints appear intact and within normal limits.

Mild soft tissue swelling and stranding about the wrist. No soft
tissue gas.
IMPRESSION: 1. Comminuted fracture of the dorsal triquetrum with mild posterior
displacement of small comminution fragments.
2. No other acute fracture at the left wrist.

## 2022-03-06 ENCOUNTER — Other Ambulatory Visit: Payer: Self-pay | Admitting: Family Medicine

## 2022-03-06 ENCOUNTER — Other Ambulatory Visit (HOSPITAL_BASED_OUTPATIENT_CLINIC_OR_DEPARTMENT_OTHER): Payer: Self-pay

## 2022-03-14 ENCOUNTER — Other Ambulatory Visit (HOSPITAL_BASED_OUTPATIENT_CLINIC_OR_DEPARTMENT_OTHER): Payer: Self-pay

## 2022-04-15 ENCOUNTER — Other Ambulatory Visit (HOSPITAL_BASED_OUTPATIENT_CLINIC_OR_DEPARTMENT_OTHER): Payer: Self-pay

## 2022-04-29 ENCOUNTER — Other Ambulatory Visit (HOSPITAL_BASED_OUTPATIENT_CLINIC_OR_DEPARTMENT_OTHER): Payer: Self-pay

## 2022-05-01 ENCOUNTER — Other Ambulatory Visit (HOSPITAL_BASED_OUTPATIENT_CLINIC_OR_DEPARTMENT_OTHER): Payer: Self-pay

## 2022-05-08 ENCOUNTER — Other Ambulatory Visit (HOSPITAL_BASED_OUTPATIENT_CLINIC_OR_DEPARTMENT_OTHER): Payer: Self-pay

## 2022-05-13 ENCOUNTER — Other Ambulatory Visit (HOSPITAL_BASED_OUTPATIENT_CLINIC_OR_DEPARTMENT_OTHER): Payer: Self-pay

## 2022-05-29 ENCOUNTER — Other Ambulatory Visit (HOSPITAL_BASED_OUTPATIENT_CLINIC_OR_DEPARTMENT_OTHER): Payer: Self-pay

## 2022-05-29 DIAGNOSIS — E782 Mixed hyperlipidemia: Secondary | ICD-10-CM | POA: Diagnosis not present

## 2022-05-29 DIAGNOSIS — I10 Essential (primary) hypertension: Secondary | ICD-10-CM | POA: Diagnosis not present

## 2022-05-29 DIAGNOSIS — E1165 Type 2 diabetes mellitus with hyperglycemia: Secondary | ICD-10-CM | POA: Diagnosis not present

## 2022-05-29 MED ORDER — PRAVASTATIN SODIUM 40 MG PO TABS
40.0000 mg | ORAL_TABLET | Freq: Every day | ORAL | 3 refills | Status: AC
  Filled 2022-05-29: qty 90, 90d supply, fill #0

## 2022-05-29 MED ORDER — METFORMIN HCL 1000 MG PO TABS
ORAL_TABLET | ORAL | 3 refills | Status: AC
  Filled 2022-05-29: qty 180, 90d supply, fill #0

## 2022-05-29 MED ORDER — MOUNJARO 7.5 MG/0.5ML ~~LOC~~ SOAJ
7.5000 mg | SUBCUTANEOUS | 1 refills | Status: AC
  Filled 2022-05-29: qty 2, 28d supply, fill #0

## 2022-05-29 MED ORDER — BISOPROLOL-HYDROCHLOROTHIAZIDE 5-6.25 MG PO TABS
1.0000 | ORAL_TABLET | Freq: Every day | ORAL | 1 refills | Status: DC
  Filled 2022-05-29: qty 90, 90d supply, fill #0

## 2022-05-29 MED ORDER — FREESTYLE LITE TEST VI STRP
ORAL_STRIP | 3 refills | Status: AC
  Filled 2022-05-29: qty 300, 90d supply, fill #0

## 2022-05-29 MED ORDER — LISINOPRIL 10 MG PO TABS
10.0000 mg | ORAL_TABLET | Freq: Every day | ORAL | 1 refills | Status: AC
Start: 2022-05-29 — End: ?
  Filled 2022-05-29 – 2022-07-10 (×2): qty 90, 90d supply, fill #0

## 2022-05-31 ENCOUNTER — Other Ambulatory Visit (HOSPITAL_BASED_OUTPATIENT_CLINIC_OR_DEPARTMENT_OTHER): Payer: Self-pay

## 2022-06-04 DIAGNOSIS — Z1231 Encounter for screening mammogram for malignant neoplasm of breast: Secondary | ICD-10-CM | POA: Diagnosis not present

## 2022-06-05 ENCOUNTER — Other Ambulatory Visit (HOSPITAL_BASED_OUTPATIENT_CLINIC_OR_DEPARTMENT_OTHER): Payer: Self-pay

## 2022-06-05 LAB — HM MAMMOGRAPHY

## 2022-06-06 ENCOUNTER — Encounter: Payer: Self-pay | Admitting: Family Medicine

## 2022-06-11 ENCOUNTER — Other Ambulatory Visit (HOSPITAL_BASED_OUTPATIENT_CLINIC_OR_DEPARTMENT_OTHER): Payer: Self-pay

## 2022-07-10 ENCOUNTER — Other Ambulatory Visit: Payer: Self-pay

## 2022-07-10 ENCOUNTER — Other Ambulatory Visit (HOSPITAL_BASED_OUTPATIENT_CLINIC_OR_DEPARTMENT_OTHER): Payer: Self-pay

## 2022-08-02 ENCOUNTER — Other Ambulatory Visit (HOSPITAL_BASED_OUTPATIENT_CLINIC_OR_DEPARTMENT_OTHER): Payer: Self-pay

## 2022-09-04 ENCOUNTER — Other Ambulatory Visit (HOSPITAL_BASED_OUTPATIENT_CLINIC_OR_DEPARTMENT_OTHER): Payer: Self-pay

## 2022-09-06 ENCOUNTER — Other Ambulatory Visit (HOSPITAL_BASED_OUTPATIENT_CLINIC_OR_DEPARTMENT_OTHER): Payer: Self-pay

## 2022-09-06 MED ORDER — MOUNJARO 5 MG/0.5ML ~~LOC~~ SOAJ
5.0000 mg | SUBCUTANEOUS | 1 refills | Status: DC
  Filled 2022-09-06: qty 2, 28d supply, fill #0

## 2022-10-02 ENCOUNTER — Other Ambulatory Visit (HOSPITAL_BASED_OUTPATIENT_CLINIC_OR_DEPARTMENT_OTHER): Payer: Self-pay

## 2022-10-02 DIAGNOSIS — Z7984 Long term (current) use of oral hypoglycemic drugs: Secondary | ICD-10-CM | POA: Diagnosis not present

## 2022-10-02 DIAGNOSIS — E782 Mixed hyperlipidemia: Secondary | ICD-10-CM | POA: Diagnosis not present

## 2022-10-02 DIAGNOSIS — I10 Essential (primary) hypertension: Secondary | ICD-10-CM | POA: Diagnosis not present

## 2022-10-02 DIAGNOSIS — Z7985 Long-term (current) use of injectable non-insulin antidiabetic drugs: Secondary | ICD-10-CM | POA: Diagnosis not present

## 2022-10-02 DIAGNOSIS — E1165 Type 2 diabetes mellitus with hyperglycemia: Secondary | ICD-10-CM | POA: Diagnosis not present

## 2022-10-02 MED ORDER — LISINOPRIL 10 MG PO TABS
10.0000 mg | ORAL_TABLET | Freq: Every day | ORAL | 1 refills | Status: DC
  Filled 2022-10-02: qty 90, 90d supply, fill #0
  Filled 2023-01-22: qty 90, 90d supply, fill #1

## 2022-10-02 MED ORDER — METFORMIN HCL 1000 MG PO TABS
1000.0000 mg | ORAL_TABLET | Freq: Two times a day (BID) | ORAL | 3 refills | Status: AC
Start: 2022-10-02 — End: ?
  Filled 2022-10-02 – 2022-11-22 (×2): qty 180, 90d supply, fill #0
  Filled 2023-02-10: qty 180, 90d supply, fill #1

## 2022-10-02 MED ORDER — PRAVASTATIN SODIUM 40 MG PO TABS
40.0000 mg | ORAL_TABLET | Freq: Every day | ORAL | 3 refills | Status: AC
  Filled 2022-10-02: qty 90, 90d supply, fill #0
  Filled 2023-01-22: qty 90, 90d supply, fill #1
  Filled 2023-05-28: qty 90, 90d supply, fill #2
  Filled 2023-09-10: qty 90, 90d supply, fill #3

## 2022-10-02 MED ORDER — MOUNJARO 7.5 MG/0.5ML ~~LOC~~ SOAJ
7.5000 mg | SUBCUTANEOUS | 1 refills | Status: AC
  Filled 2022-10-02: qty 2, 28d supply, fill #0
  Filled 2022-11-01: qty 2, 28d supply, fill #1
  Filled 2022-11-29: qty 2, 28d supply, fill #2
  Filled 2022-12-27: qty 2, 28d supply, fill #3
  Filled 2023-01-27: qty 2, 28d supply, fill #4
  Filled 2023-02-24: qty 2, 28d supply, fill #5

## 2022-10-04 ENCOUNTER — Other Ambulatory Visit (HOSPITAL_BASED_OUTPATIENT_CLINIC_OR_DEPARTMENT_OTHER): Payer: Self-pay

## 2022-10-24 DIAGNOSIS — E119 Type 2 diabetes mellitus without complications: Secondary | ICD-10-CM | POA: Diagnosis not present

## 2022-10-24 DIAGNOSIS — H524 Presbyopia: Secondary | ICD-10-CM | POA: Diagnosis not present

## 2022-11-01 ENCOUNTER — Other Ambulatory Visit (HOSPITAL_BASED_OUTPATIENT_CLINIC_OR_DEPARTMENT_OTHER): Payer: Self-pay

## 2022-11-22 ENCOUNTER — Other Ambulatory Visit (HOSPITAL_BASED_OUTPATIENT_CLINIC_OR_DEPARTMENT_OTHER): Payer: Self-pay

## 2022-11-29 ENCOUNTER — Other Ambulatory Visit (HOSPITAL_BASED_OUTPATIENT_CLINIC_OR_DEPARTMENT_OTHER): Payer: Self-pay

## 2022-12-27 ENCOUNTER — Other Ambulatory Visit (HOSPITAL_BASED_OUTPATIENT_CLINIC_OR_DEPARTMENT_OTHER): Payer: Self-pay

## 2023-01-22 ENCOUNTER — Other Ambulatory Visit (HOSPITAL_BASED_OUTPATIENT_CLINIC_OR_DEPARTMENT_OTHER): Payer: Self-pay

## 2023-01-27 ENCOUNTER — Other Ambulatory Visit (HOSPITAL_BASED_OUTPATIENT_CLINIC_OR_DEPARTMENT_OTHER): Payer: Self-pay

## 2023-02-10 ENCOUNTER — Other Ambulatory Visit (HOSPITAL_BASED_OUTPATIENT_CLINIC_OR_DEPARTMENT_OTHER): Payer: Self-pay

## 2023-02-24 ENCOUNTER — Other Ambulatory Visit (HOSPITAL_BASED_OUTPATIENT_CLINIC_OR_DEPARTMENT_OTHER): Payer: Self-pay

## 2023-03-20 ENCOUNTER — Other Ambulatory Visit (HOSPITAL_BASED_OUTPATIENT_CLINIC_OR_DEPARTMENT_OTHER): Payer: Self-pay

## 2023-03-20 DIAGNOSIS — E782 Mixed hyperlipidemia: Secondary | ICD-10-CM | POA: Diagnosis not present

## 2023-03-20 DIAGNOSIS — I10 Essential (primary) hypertension: Secondary | ICD-10-CM | POA: Diagnosis not present

## 2023-03-20 DIAGNOSIS — Z7985 Long-term (current) use of injectable non-insulin antidiabetic drugs: Secondary | ICD-10-CM | POA: Diagnosis not present

## 2023-03-20 DIAGNOSIS — E1165 Type 2 diabetes mellitus with hyperglycemia: Secondary | ICD-10-CM | POA: Diagnosis not present

## 2023-03-20 DIAGNOSIS — Z7984 Long term (current) use of oral hypoglycemic drugs: Secondary | ICD-10-CM | POA: Diagnosis not present

## 2023-03-20 MED ORDER — FREESTYLE LITE TEST VI STRP
ORAL_STRIP | 3 refills | Status: AC
  Filled 2023-03-20: qty 300, 90d supply, fill #0

## 2023-03-20 MED ORDER — MOUNJARO 7.5 MG/0.5ML ~~LOC~~ SOAJ
7.5000 mg | SUBCUTANEOUS | 1 refills | Status: DC
  Filled 2023-03-20: qty 6, 84d supply, fill #0
  Filled 2023-06-26: qty 2, 28d supply, fill #1
  Filled 2023-07-23: qty 2, 28d supply, fill #2
  Filled 2024-02-03: qty 2, 28d supply, fill #3

## 2023-03-20 MED ORDER — METFORMIN HCL 1000 MG PO TABS
1000.0000 mg | ORAL_TABLET | Freq: Two times a day (BID) | ORAL | 3 refills | Status: AC
  Filled 2023-03-20 – 2023-06-26 (×2): qty 180, 90d supply, fill #0
  Filled 2023-10-10: qty 180, 90d supply, fill #1

## 2023-03-24 ENCOUNTER — Other Ambulatory Visit (HOSPITAL_BASED_OUTPATIENT_CLINIC_OR_DEPARTMENT_OTHER): Payer: Self-pay

## 2023-05-08 ENCOUNTER — Other Ambulatory Visit: Payer: Self-pay | Admitting: Family Medicine

## 2023-05-08 ENCOUNTER — Other Ambulatory Visit (HOSPITAL_BASED_OUTPATIENT_CLINIC_OR_DEPARTMENT_OTHER): Payer: Self-pay

## 2023-05-08 MED ORDER — LISINOPRIL 10 MG PO TABS
10.0000 mg | ORAL_TABLET | Freq: Every day | ORAL | 1 refills | Status: AC
  Filled 2023-05-08: qty 90, 90d supply, fill #0
  Filled 2024-03-05: qty 90, 90d supply, fill #1

## 2023-05-09 ENCOUNTER — Other Ambulatory Visit (HOSPITAL_BASED_OUTPATIENT_CLINIC_OR_DEPARTMENT_OTHER): Payer: Self-pay

## 2023-05-28 ENCOUNTER — Other Ambulatory Visit (HOSPITAL_BASED_OUTPATIENT_CLINIC_OR_DEPARTMENT_OTHER): Payer: Self-pay

## 2023-06-26 ENCOUNTER — Other Ambulatory Visit (HOSPITAL_BASED_OUTPATIENT_CLINIC_OR_DEPARTMENT_OTHER): Payer: Self-pay

## 2023-07-23 ENCOUNTER — Other Ambulatory Visit (HOSPITAL_BASED_OUTPATIENT_CLINIC_OR_DEPARTMENT_OTHER): Payer: Self-pay

## 2023-07-24 DIAGNOSIS — Z1231 Encounter for screening mammogram for malignant neoplasm of breast: Secondary | ICD-10-CM | POA: Diagnosis not present

## 2023-07-24 DIAGNOSIS — Z1239 Encounter for other screening for malignant neoplasm of breast: Secondary | ICD-10-CM | POA: Diagnosis not present

## 2023-08-07 ENCOUNTER — Other Ambulatory Visit (HOSPITAL_BASED_OUTPATIENT_CLINIC_OR_DEPARTMENT_OTHER): Payer: Self-pay

## 2023-08-07 ENCOUNTER — Other Ambulatory Visit: Payer: Self-pay

## 2023-08-07 DIAGNOSIS — E1165 Type 2 diabetes mellitus with hyperglycemia: Secondary | ICD-10-CM | POA: Diagnosis not present

## 2023-08-07 DIAGNOSIS — E782 Mixed hyperlipidemia: Secondary | ICD-10-CM | POA: Diagnosis not present

## 2023-08-07 DIAGNOSIS — I10 Essential (primary) hypertension: Secondary | ICD-10-CM | POA: Diagnosis not present

## 2023-08-07 MED ORDER — METFORMIN HCL 1000 MG PO TABS
1000.0000 mg | ORAL_TABLET | Freq: Two times a day (BID) | ORAL | 3 refills | Status: AC
  Filled 2023-08-07 – 2024-02-03 (×2): qty 180, 90d supply, fill #0

## 2023-08-07 MED ORDER — PRAVASTATIN SODIUM 40 MG PO TABS
40.0000 mg | ORAL_TABLET | Freq: Every day | ORAL | 3 refills | Status: AC
  Filled 2023-08-07 – 2024-01-02 (×2): qty 90, 90d supply, fill #0
  Filled ????-??-??: fill #0

## 2023-08-07 MED ORDER — LISINOPRIL 10 MG PO TABS
10.0000 mg | ORAL_TABLET | Freq: Every day | ORAL | 1 refills | Status: AC
  Filled 2023-08-07: qty 90, 90d supply, fill #0
  Filled 2023-11-27: qty 90, 90d supply, fill #1

## 2023-08-07 MED ORDER — MOUNJARO 7.5 MG/0.5ML ~~LOC~~ SOAJ
7.5000 mg | SUBCUTANEOUS | 1 refills | Status: AC
  Filled 2023-08-07 – 2023-08-15 (×2): qty 6, 84d supply, fill #0
  Filled 2023-11-11: qty 6, 84d supply, fill #1

## 2023-08-15 ENCOUNTER — Other Ambulatory Visit: Payer: Self-pay

## 2023-08-15 ENCOUNTER — Other Ambulatory Visit (HOSPITAL_BASED_OUTPATIENT_CLINIC_OR_DEPARTMENT_OTHER): Payer: Self-pay

## 2023-09-10 ENCOUNTER — Other Ambulatory Visit (HOSPITAL_BASED_OUTPATIENT_CLINIC_OR_DEPARTMENT_OTHER): Payer: Self-pay

## 2023-10-10 ENCOUNTER — Other Ambulatory Visit (HOSPITAL_BASED_OUTPATIENT_CLINIC_OR_DEPARTMENT_OTHER): Payer: Self-pay

## 2023-10-27 DIAGNOSIS — E119 Type 2 diabetes mellitus without complications: Secondary | ICD-10-CM | POA: Diagnosis not present

## 2023-10-27 DIAGNOSIS — Z135 Encounter for screening for eye and ear disorders: Secondary | ICD-10-CM | POA: Diagnosis not present

## 2023-10-27 DIAGNOSIS — H524 Presbyopia: Secondary | ICD-10-CM | POA: Diagnosis not present

## 2023-11-11 ENCOUNTER — Other Ambulatory Visit (HOSPITAL_BASED_OUTPATIENT_CLINIC_OR_DEPARTMENT_OTHER): Payer: Self-pay

## 2023-11-27 ENCOUNTER — Other Ambulatory Visit (HOSPITAL_BASED_OUTPATIENT_CLINIC_OR_DEPARTMENT_OTHER): Payer: Self-pay

## 2024-01-02 ENCOUNTER — Other Ambulatory Visit (HOSPITAL_BASED_OUTPATIENT_CLINIC_OR_DEPARTMENT_OTHER): Payer: Self-pay

## 2024-02-03 ENCOUNTER — Other Ambulatory Visit (HOSPITAL_BASED_OUTPATIENT_CLINIC_OR_DEPARTMENT_OTHER): Payer: Self-pay

## 2024-03-05 ENCOUNTER — Other Ambulatory Visit (HOSPITAL_BASED_OUTPATIENT_CLINIC_OR_DEPARTMENT_OTHER): Payer: Self-pay

## 2024-03-09 ENCOUNTER — Other Ambulatory Visit (HOSPITAL_BASED_OUTPATIENT_CLINIC_OR_DEPARTMENT_OTHER): Payer: Self-pay

## 2024-03-09 ENCOUNTER — Other Ambulatory Visit: Payer: Self-pay

## 2024-03-09 MED ORDER — MOUNJARO 7.5 MG/0.5ML ~~LOC~~ SOAJ
7.5000 mg | SUBCUTANEOUS | 0 refills | Status: AC
  Filled 2024-03-09 – 2024-03-15 (×2): qty 2, 28d supply, fill #0

## 2024-03-15 ENCOUNTER — Other Ambulatory Visit (HOSPITAL_BASED_OUTPATIENT_CLINIC_OR_DEPARTMENT_OTHER): Payer: Self-pay

## 2024-03-31 ENCOUNTER — Other Ambulatory Visit (HOSPITAL_BASED_OUTPATIENT_CLINIC_OR_DEPARTMENT_OTHER): Payer: Self-pay
